# Patient Record
Sex: Female | Born: 1965 | Race: White | Hispanic: No | Marital: Married | Smoking: Never smoker
Health system: Southern US, Community
[De-identification: ages and names within clinical notes are randomized; demographics above are authoritative.]

## PROBLEM LIST (undated history)

## (undated) DIAGNOSIS — C801 Malignant (primary) neoplasm, unspecified: Secondary | ICD-10-CM

## (undated) DIAGNOSIS — C50919 Malignant neoplasm of unspecified site of unspecified female breast: Secondary | ICD-10-CM

## (undated) HISTORY — PX: ABDOMINAL HYSTERECTOMY: SHX81

---

## 2016-01-08 ENCOUNTER — Encounter (HOSPITAL_COMMUNITY): Payer: Self-pay

## 2016-01-08 ENCOUNTER — Emergency Department (HOSPITAL_COMMUNITY): Payer: PRIVATE HEALTH INSURANCE

## 2016-01-08 ENCOUNTER — Emergency Department (HOSPITAL_COMMUNITY)
Admission: EM | Admit: 2016-01-08 | Discharge: 2016-01-08 | Disposition: A | Discharge status: Home / Self Care | Payer: PRIVATE HEALTH INSURANCE | Attending: Emergency Medicine | Admitting: Emergency Medicine

## 2016-01-08 DIAGNOSIS — R1030 Lower abdominal pain, unspecified: Secondary | ICD-10-CM | POA: Diagnosis present

## 2016-01-08 DIAGNOSIS — R17 Unspecified jaundice: Secondary | ICD-10-CM

## 2016-01-08 DIAGNOSIS — K8020 Calculus of gallbladder without cholecystitis without obstruction: Secondary | ICD-10-CM | POA: Insufficient documentation

## 2016-01-08 DIAGNOSIS — Z8543 Personal history of malignant neoplasm of ovary: Secondary | ICD-10-CM | POA: Diagnosis not present

## 2016-01-08 HISTORY — DX: Malignant (primary) neoplasm, unspecified: C80.1

## 2016-01-08 LAB — COMPREHENSIVE METABOLIC PANEL
ALK PHOS: 273 U/L — AB (ref 38–126)
ALT: 315 U/L — ABNORMAL HIGH (ref 14–54)
ANION GAP: 11 (ref 5–15)
AST: 178 U/L — ABNORMAL HIGH (ref 15–41)
Albumin: 4 g/dL (ref 3.5–5.0)
BILIRUBIN TOTAL: 6.5 mg/dL — AB (ref 0.3–1.2)
BUN: 9 mg/dL (ref 6–20)
CALCIUM: 9.5 mg/dL (ref 8.9–10.3)
CO2: 22 mmol/L (ref 22–32)
Chloride: 105 mmol/L (ref 101–111)
Creatinine, Ser: 0.49 mg/dL (ref 0.44–1.00)
GLUCOSE: 102 mg/dL — AB (ref 65–99)
POTASSIUM: 3.8 mmol/L (ref 3.5–5.1)
SODIUM: 138 mmol/L (ref 135–145)
TOTAL PROTEIN: 7.7 g/dL (ref 6.5–8.1)

## 2016-01-08 LAB — CBC WITH DIFFERENTIAL/PLATELET
BASOS ABS: 0 10*3/uL (ref 0.0–0.1)
Basophils Relative: 0 %
EOS ABS: 0.2 10*3/uL (ref 0.0–0.7)
EOS PCT: 2 %
HCT: 51.5 % — ABNORMAL HIGH (ref 36.0–46.0)
Hemoglobin: 17.6 g/dL — ABNORMAL HIGH (ref 12.0–15.0)
LYMPHS PCT: 34 %
Lymphs Abs: 3.1 10*3/uL (ref 0.7–4.0)
MCH: 33.9 pg (ref 26.0–34.0)
MCHC: 34.2 g/dL (ref 30.0–36.0)
MCV: 99.2 fL (ref 78.0–100.0)
MONO ABS: 0.7 10*3/uL (ref 0.1–1.0)
Monocytes Relative: 7 %
Neutro Abs: 5.2 10*3/uL (ref 1.7–7.7)
Neutrophils Relative %: 57 %
PLATELETS: 215 10*3/uL (ref 150–400)
RBC: 5.19 MIL/uL — ABNORMAL HIGH (ref 3.87–5.11)
RDW: 14 % (ref 11.5–15.5)
WBC: 9.2 10*3/uL (ref 4.0–10.5)

## 2016-01-08 LAB — URINALYSIS, ROUTINE W REFLEX MICROSCOPIC
GLUCOSE, UA: NEGATIVE mg/dL
HGB URINE DIPSTICK: NEGATIVE
Nitrite: POSITIVE — AB
PROTEIN: 30 mg/dL — AB
Specific Gravity, Urine: 1.03 (ref 1.005–1.030)
pH: 5 (ref 5.0–8.0)

## 2016-01-08 LAB — URINE MICROSCOPIC-ADD ON: RBC / HPF: NONE SEEN RBC/hpf (ref 0–5)

## 2016-01-08 LAB — LIPASE, BLOOD: Lipase: 84 U/L — ABNORMAL HIGH (ref 11–51)

## 2016-01-08 MED ORDER — IOHEXOL 300 MG/ML  SOLN
100.0000 mL | Freq: Once | INTRAMUSCULAR | Status: AC | PRN
  Administered 2016-01-08: 100 mL via INTRAVENOUS

## 2016-01-08 MED ORDER — SODIUM CHLORIDE 0.9 % IV BOLUS (SEPSIS)
1000.0000 mL | Freq: Once | INTRAVENOUS | Status: AC
  Administered 2016-01-08: 1000 mL via INTRAVENOUS

## 2016-01-08 MED ORDER — ONDANSETRON HCL 4 MG/2ML IJ SOLN
4.0000 mg | Freq: Once | INTRAMUSCULAR | Status: AC
  Administered 2016-01-08: 4 mg via INTRAVENOUS
  Filled 2016-01-08: qty 2

## 2016-01-08 MED ORDER — SODIUM CHLORIDE 0.9 % IV SOLN: INTRAVENOUS | Status: DC

## 2016-01-08 NOTE — ED Notes (Addendum)
Pt reports 2 days ago started vomting and noticed her stools were white.  Also reports noticed sclera looked yellow.  Reports urine is orange.  Pt c/o nausea and fatigue.   Says feels like something is "sitting" in her epigastric area.

## 2016-01-08 NOTE — Discharge Instructions (Signed)
Cholelithiasis Cholelithiasis (also called gallstones) is a form of gallbladder disease in which gallstones form in your gallbladder. The gallbladder is an organ that stores bile made in the liver, which helps digest fats. Gallstones begin as small crystals and slowly grow into stones. Gallstone pain occurs when the gallbladder spasms and a gallstone is blocking the duct. Pain can also occur when a stone passes out of the duct.  RISK FACTORS  Being female.   Having multiple pregnancies. Health care providers sometimes advise removing diseased gallbladders before future pregnancies.   Being obese.  Eating a diet heavy in fried foods and fat.   Being older than 41 years and increasing age.   Prolonged use of medicines containing female hormones.   Having diabetes mellitus.   Rapidly losing weight.   Having a family history of gallstones (heredity).  SYMPTOMS  Nausea.   Vomiting.  Abdominal pain.   Yellowing of the skin (jaundice).   Sudden pain. It may persist from several minutes to several hours.  Fever.   Tenderness to the touch. In some cases, when gallstones do not move into the bile duct, people have no pain or symptoms. These are called "silent" gallstones.  TREATMENT Silent gallstones do not need treatment. In severe cases, emergency surgery may be required. Options for treatment include:  Surgery to remove the gallbladder. This is the most common treatment.  Medicines. These do not always work and may take 6-12 months or more to work.  Shock wave treatment (extracorporeal biliary lithotripsy). In this treatment an ultrasound machine sends shock waves to the gallbladder to break gallstones into smaller pieces that can pass into the intestines or be dissolved by medicine. HOME CARE INSTRUCTIONS   Only take over-the-counter or prescription medicines for pain, discomfort, or fever as directed by your health care provider.   Follow a low-fat diet until  seen again by your health care provider. Fat causes the gallbladder to contract, which can result in pain.   Follow up with your health care provider as directed. Attacks are almost always recurrent and surgery is usually required for permanent treatment.  SEEK IMMEDIATE MEDICAL CARE IF:   Your pain increases and is not controlled by medicines.   You have a fever or persistent symptoms for more than 2-3 days.   You have a fever and your symptoms suddenly get worse.   You have persistent nausea and vomiting.  MAKE SURE YOU:   Understand these instructions.  Will watch your condition.  Will get help right away if you are not doing well or get worse.   This information is not intended to replace advice given to you by your health care provider. Make sure you discuss any questions you have with your health care provider.   Document Released: 10/02/2005 Document Revised: 06/08/2013 Document Reviewed: 03/30/2013 Elsevier Interactive Patient Education 2016 Elsevier Inc.  Jaundice, Adult Jaundice is a yellowish discoloration of the skin, the whites of the eyes, and mucous membranes. Jaundice can be a sign that the liver or the bile system is not working normally. CAUSES This condition is caused by an increased level of bilirubin in the blood. Bilirubin is a substance that is produced by the normal breakdown of red blood cells. Conditions and activities that can cause an increase in the bilirubin level include:  Viral hepatitis.  Gallstones or other conditions, such as a tumor, that can cause a blockage of bile ducts.  Excessive use of alcohol.  Other liver diseases, such as cirrhosis.  Certain cancers.  Certain infections.  Certain genetic syndromes.  Certain medicines. SYMPTOMS Symptoms of this condition include:  Yellow color to the skin, the whites of the eyes, or mucous membranes.  Dark brown urine.  Stomach pain.  Light or clay-colored stool.  Itchy skin  (pruritus). DIAGNOSIS This condition is diagnosed with a medical history, physical exam, and blood tests. You may have additional tests to determine what is causing your bilirubin level to increase. TREATMENT Treatment for jaundice depends on the underlying condition. Treatment may include:  Stopping the use of a certain medicine.  Fluids that are given through an IV tube that is inserted into one of your veins.  Medicines to treat pruritus.  Surgery, if there is blockage of the bile ducts. HOME CARE INSTRUCTIONS  Drink enough fluid to keep your urine clear or pale yellow.  Do not drink alcohol.  Take medicines only as directed by your health care provider.  Keep all follow-up visits as directed by your health care provider. This is important.  You may use skin lotion to relieve itching. SEEK MEDICAL CARE IF:  You have a fever. SEEK IMMEDIATE MEDICAL CARE IF:  Your symptoms suddenly get worse.  You have symptoms for more than 72 hours.  Your pain gets worse.  You vomit repeatedly.  You become weak or confused.  You develop a severe headache.  You become severely dehydrated. Signs of severe dehydration include:  A very dry mouth.  A rapid, weak pulse.  Rapid breathing.  Blue lips.   This information is not intended to replace advice given to you by your health care provider. Make sure you discuss any questions you have with your health care provider.   Document Released: 10/06/2005 Document Revised: 02/20/2015 Document Reviewed: 10/02/2014 Elsevier Interactive Patient Education [employer].

## 2016-01-08 NOTE — ED Provider Notes (Signed)
History  By signing my name below, I, Marlowe Kays, attest that this documentation has been prepared under the direction and in the presence of Dorie Rank, MD. Electronically Signed: Marlowe Kays, ED Scribe. 01/08/2016. 12:47 PM.  Chief Complaint  Patient presents with  . Abdominal Pain   The history is provided by the patient and medical records. No language interpreter was used.    HPI Comments:  Angela Reilly is a 50 y.o. female who presents to the Emergency Department complaining of abdominal pain that began two days ago. She reports associated nausea and vomiting. She states she is experiencing lower abdominal cramping as well. She has not taken anything for her symptoms but reports drinking warm ginger ale. She denies modifying factors. She denies fever, chills. She reports having a hysterectomy. She reports PMHx of ovarian cancer.   Past Medical History  Diagnosis Date  . Cancer ([place])     ovarian   Past Surgical History  Procedure Laterality Date  . Abdominal hysterectomy     No family history on file. Social History  Substance Use Topics  . Smoking status: Never Smoker   . Smokeless tobacco: None  . Alcohol Use: No   OB History    No data available     Review of Systems  Constitutional: Negative for fever and chills.  Gastrointestinal: Positive for nausea, vomiting and abdominal pain.  All other systems reviewed and are negative.   Allergies  Review of patient's allergies indicates no known allergies.  Home Medications   Prior to Admission medications   Not on File   Triage Vitals: BP 143/101 mmHg  Pulse 84  Temp(Src) 97.8 F (36.6 C) (Oral)  Resp 20  Ht 5\' 10"  (1.778 m)  Wt 237 lb (107.502 kg)  BMI 34.01 kg/m2  SpO2 97% Physical Exam  Constitutional: She appears well-developed and well-nourished. No distress.  HENT:  Head: Normocephalic and atraumatic.  Right Ear: External ear normal.  Left Ear: External ear normal.  Eyes: Conjunctivae  are normal. Right eye exhibits no discharge. Left eye exhibits no discharge. Scleral icterus is present.  Neck: Neck supple. No tracheal deviation present.  Cardiovascular: Normal rate, regular rhythm and intact distal pulses.   Pulmonary/Chest: Effort normal and breath sounds normal. No stridor. No respiratory distress. She has no wheezes. She has no rales.  Abdominal: Soft. Bowel sounds are normal. She exhibits no distension and no mass. There is no hepatosplenomegaly. There is no tenderness. There is no rebound and no guarding. No hernia.  Musculoskeletal: She exhibits no edema or tenderness.  Neurological: She is alert. She has normal strength. No cranial nerve deficit (no facial droop, extraocular movements intact, no slurred speech) or sensory deficit. She exhibits normal muscle tone. She displays no seizure activity. Coordination normal.  Skin: Skin is warm and dry. No rash noted.  Psychiatric: She has a normal mood and affect.  Nursing note and vitals reviewed.   ED Course  Procedures (including critical care time) DIAGNOSTIC STUDIES: Oxygen Saturation is 97% on RA, normal by my interpretation.   COORDINATION OF CARE: 12:45 PM- Will order labs.  Pt verbalizes understanding and agrees to plan.  Medications  sodium chloride 0.9 % bolus 1,000 mL (0 mLs Intravenous Stopped 01/08/16 1453)    And  0.9 %  sodium chloride infusion (not administered)  ondansetron (ZOFRAN) injection 4 mg (4 mg Intravenous Given 01/08/16 1300)  iohexol (OMNIPAQUE) 300 MG/ML solution 100 mL (100 mLs Intravenous Contrast Given 01/08/16 1608)  Labs Review Labs Reviewed  CBC WITH DIFFERENTIAL/PLATELET - Abnormal; Notable for the following:    RBC 5.19 (*)    Hemoglobin 17.6 (*)    HCT 51.5 (*)    All other components within normal limits  COMPREHENSIVE METABOLIC PANEL - Abnormal; Notable for the following:    Glucose, Bld 102 (*)    AST 178 (*)    ALT 315 (*)    Alkaline Phosphatase 273 (*)    Total  Bilirubin 6.5 (*)    All other components within normal limits  URINALYSIS, ROUTINE W REFLEX MICROSCOPIC (NOT AT [facility]) - Abnormal; Notable for the following:    Color, Urine AMBER (*)    APPearance HAZY (*)    Bilirubin Urine LARGE (*)    Ketones, ur >80 (*)    Protein, ur 30 (*)    Nitrite POSITIVE (*)    Leukocytes, UA SMALL (*)    All other components within normal limits  URINE MICROSCOPIC-ADD ON - Abnormal; Notable for the following:    Squamous Epithelial / LPF TOO NUMEROUS TO COUNT (*)    Bacteria, UA MANY (*)    All other components within normal limits  LIPASE, BLOOD - Abnormal; Notable for the following:    Lipase 84 (*)    All other components within normal limits    Imaging Review US Abdomen Complete  01/08/2016  CLINICAL DATA:  Elevated liver function tests, CT scan showed possible gallstones EXAM: ABDOMEN ULTRASOUND COMPLETE COMPARISON:  None. FINDINGS: Gallbladder: Numerous tiny gallstones, mobile. No wall thickening or Murphy sign. Common bile duct: Diameter: 3 mm Liver: Mild diffuse steatosis with no focal abnormalities. IVC: No abnormality visualized. Pancreas: Visualized portion unremarkable. Spleen: Size and appearance within normal limits. Right Kidney: Length: 11.9 cm. Echogenicity within normal limits. No mass or hydronephrosis visualized. Left Kidney: Length: 12.4 cm. Echogenicity within normal limits. No mass or hydronephrosis visualized. Abdominal aorta: No aneurysm visualized. Other findings: No ascites IMPRESSION: Cholelithiasis.  Hepatic steatosis. Electronically Signed   By: Skipper Cliche M.D.   On: 01/08/2016 17:27   Ct Abdomen Pelvis W Contrast  01/08/2016  CLINICAL DATA:  Epigastric pain for 2 days.  Jaundice. EXAM: CT ABDOMEN AND PELVIS WITH CONTRAST TECHNIQUE: Multidetector CT imaging of the abdomen and pelvis was performed using the standard protocol following bolus administration of intravenous contrast. CONTRAST:  19mL OMNIPAQUE IOHEXOL 300 MG/ML   SOLN COMPARISON:  None. FINDINGS: Diffuse hepatic steatosis There is soft tissue density mature and layering in the gallbladder worrisome for gallstones. It may represent sludge. Pancreas, spleen, adrenal glands are within normal limits Simple cyst in the lower pole of the right kidney. Left kidney is unremarkable There is fatty infiltration within the wall of the cecum. Normal appendix. Sigmoid colon is decompressed. Bladder is decompressed.  Uterus is absent.  Adnexa are unremarkable Right inguinal hernia contains adipose tissue No free-fluid.  No abnormal adenopathy. IMPRESSION: Gallstones or sludge.  Ultrasound is recommended. Right inguinal hernia. Electronically Signed   By: Marybelle Killings M.D.   On: 01/08/2016 16:21   I have personally reviewed and evaluated these images and lab results as part of my medical decision-making.  Medications  sodium chloride 0.9 % bolus 1,000 mL (0 mLs Intravenous Stopped 01/08/16 1453)    And  0.9 %  sodium chloride infusion (not administered)  ondansetron (ZOFRAN) injection 4 mg (4 mg Intravenous Given 01/08/16 1300)  iohexol (OMNIPAQUE) 300 MG/ML solution 100 mL (100 mLs Intravenous Contrast Given 01/08/16 1608)  MDM   Final diagnoses:  Elevated bilirubin  Calculus of gallbladder without cholecystitis without obstruction   The patient's laboratory tests are significant for elevated bilirubin and LFTs. I was concerned about the possibility of a liver mass considering her history of ovarian cancer. CT scan did not show any evidence of mass however suggests the possibility of gallbladder sludge. Ultrasound was performed showing cholelithiasis but no evidence of choledocholithiasis. Patient doesn't have enlarged biliary or hepatic ducts.  I suspect that she may have had an episode of choledocholithiasis couple of days ago that caused symptoms today. The patient is feeling well. She's hungry. She has no abdominal pain. She does not want to be admitted to the  hospital and wants to go home. I spoke with Dr. Gala Romney. He will see her in the office for expedited evaluation. The patient will call the office tomorrow to schedule follow-up. The patient is instructed to return to the emergency room she begins having any trouble with abdominal pain fever vomiting   I personally performed the services described in this documentation, which was scribed in my presence.  The recorded information has been reviewed and is accurate.     Dorie Rank, MD 01/08/16 [id number]

## 2016-01-09 ENCOUNTER — Telehealth: Payer: Self-pay | Admitting: Gastroenterology

## 2016-01-09 NOTE — Telephone Encounter (Signed)
Pt called to make OV with [place]. She was seen in the ED yesterday and was referred to [place] as a new patient. Please advise.

## 2016-01-09 NOTE — Telephone Encounter (Signed)
Patient is on tomorrow's schedule

## 2016-01-10 ENCOUNTER — Other Ambulatory Visit: Payer: Self-pay

## 2016-01-10 ENCOUNTER — Encounter: Payer: Self-pay | Admitting: Gastroenterology

## 2016-01-10 ENCOUNTER — Ambulatory Visit (INDEPENDENT_AMBULATORY_CARE_PROVIDER_SITE_OTHER): Payer: PRIVATE HEALTH INSURANCE | Admitting: Gastroenterology

## 2016-01-10 ENCOUNTER — Other Ambulatory Visit: Payer: Self-pay | Admitting: Gastroenterology

## 2016-01-10 ENCOUNTER — Other Ambulatory Visit (HOSPITAL_COMMUNITY)
Admission: AD | Admit: 2016-01-10 | Discharge: 2016-01-10 | Disposition: A | Discharge status: Home / Self Care | Payer: No Typology Code available for payment source | Source: Ambulatory Visit | Attending: Gastroenterology | Admitting: Gastroenterology

## 2016-01-10 ENCOUNTER — Ambulatory Visit (HOSPITAL_COMMUNITY)
Admission: RE | Admit: 2016-01-10 | Discharge: 2016-01-10 | Disposition: A | Discharge status: Home / Self Care | Payer: No Typology Code available for payment source | Source: Ambulatory Visit | Attending: Gastroenterology | Admitting: Gastroenterology

## 2016-01-10 VITALS — BP 135/93 | HR 81 | Temp 97.4°F | Ht 70.0 in | Wt 237.6 lb

## 2016-01-10 DIAGNOSIS — K8021 Calculus of gallbladder without cholecystitis with obstruction: Secondary | ICD-10-CM

## 2016-01-10 DIAGNOSIS — R109 Unspecified abdominal pain: Secondary | ICD-10-CM | POA: Insufficient documentation

## 2016-01-10 DIAGNOSIS — R1013 Epigastric pain: Secondary | ICD-10-CM | POA: Diagnosis not present

## 2016-01-10 DIAGNOSIS — R7989 Other specified abnormal findings of blood chemistry: Secondary | ICD-10-CM | POA: Diagnosis present

## 2016-01-10 DIAGNOSIS — K838 Other specified diseases of biliary tract: Secondary | ICD-10-CM

## 2016-01-10 DIAGNOSIS — K831 Obstruction of bile duct: Secondary | ICD-10-CM

## 2016-01-10 LAB — COMPREHENSIVE METABOLIC PANEL
ALK PHOS: 300 U/L — AB (ref 38–126)
ALT: 208 U/L — ABNORMAL HIGH (ref 14–54)
ANION GAP: 8 (ref 5–15)
AST: 89 U/L — ABNORMAL HIGH (ref 15–41)
Albumin: 3.9 g/dL (ref 3.5–5.0)
BILIRUBIN TOTAL: 3.3 mg/dL — AB (ref 0.3–1.2)
BUN: 9 mg/dL (ref 6–20)
CALCIUM: 9 mg/dL (ref 8.9–10.3)
CO2: 23 mmol/L (ref 22–32)
CREATININE: 0.6 mg/dL (ref 0.44–1.00)
Chloride: 103 mmol/L (ref 101–111)
Glucose, Bld: 115 mg/dL — ABNORMAL HIGH (ref 65–99)
Potassium: 4 mmol/L (ref 3.5–5.1)
SODIUM: 134 mmol/L — AB (ref 135–145)
TOTAL PROTEIN: 7.3 g/dL (ref 6.5–8.1)

## 2016-01-10 LAB — PROTIME-INR
INR: 0.94 (ref 0.00–1.49)
PROTHROMBIN TIME: 12.7 s (ref 11.6–15.2)

## 2016-01-10 LAB — CBC WITH DIFFERENTIAL/PLATELET
BASOS ABS: 0 10*3/uL (ref 0.0–0.1)
BASOS PCT: 0 %
EOS ABS: 0.2 10*3/uL (ref 0.0–0.7)
EOS PCT: 2 %
HCT: 51.1 % — ABNORMAL HIGH (ref 36.0–46.0)
HEMOGLOBIN: 17.8 g/dL — AB (ref 12.0–15.0)
Lymphocytes Relative: 35 %
Lymphs Abs: 3.5 10*3/uL (ref 0.7–4.0)
MCH: 34.4 pg — ABNORMAL HIGH (ref 26.0–34.0)
MCHC: 34.8 g/dL (ref 30.0–36.0)
MCV: 98.8 fL (ref 78.0–100.0)
Monocytes Absolute: 0.7 10*3/uL (ref 0.1–1.0)
Monocytes Relative: 7 %
NEUTROS PCT: 56 %
Neutro Abs: 5.6 10*3/uL (ref 1.7–7.7)
PLATELETS: 244 10*3/uL (ref 150–400)
RBC: 5.17 MIL/uL — AB (ref 3.87–5.11)
RDW: 13.9 % (ref 11.5–15.5)
WBC: 9.9 10*3/uL (ref 4.0–10.5)

## 2016-01-10 LAB — BILIRUBIN, DIRECT: Bilirubin, Direct: 1.5 mg/dL — ABNORMAL HIGH (ref 0.1–0.5)

## 2016-01-10 LAB — LIPASE, BLOOD: LIPASE: 120 U/L — AB (ref 11–51)

## 2016-01-10 MED ORDER — HYDROCODONE-ACETAMINOPHEN 5-325 MG PO TABS: 1.0000 | ORAL_TABLET | ORAL | Status: DC | PRN

## 2016-01-10 MED ORDER — ONDANSETRON HCL 4 MG PO TABS: 4.0000 mg | ORAL_TABLET | Freq: Four times a day (QID) | ORAL | Status: DC | PRN

## 2016-01-10 NOTE — Progress Notes (Signed)
Quick Note:  Please arrange for ASAP MRCP per RMR.  Her numbers are not much better.  See if lab can add on direct bilirubin. ______

## 2016-01-10 NOTE — Patient Instructions (Signed)
Please have your labs done. Clear liquid diet only.  RX given for zofran for nausea and vicodin for pain.  If you develop fever, worsening pain, unable to stay hydrated you should go straight to the ER.

## 2016-01-10 NOTE — Progress Notes (Signed)
Primary Care Physician:  No PCP Per Patient  Primary Gastroenterologist:  Garfield Cornea, MD   Chief Complaint  Patient presents with  . Hospitalization Follow-up    Vomiting alot    HPI:  Angela Reilly is a 50 y.o. female here as an urgent new patient appointment for jaundice, vomiting, abdominal pain. This is her fifth day of illness. Presented to the emergency department earlier this week with upper abdominal pain associated with vomiting. She had a lipase minimally elevated at 84, total bilirubin of 6.5, alkaline phosphatase 273, AST 178, ALT 315. CT of the abdomen and pelvis with contrast showed fatty liver, questionable gallbladder stones versus sludge. Fatty infiltration of the wall of the cecum. Abdominal ultrasound same day with common bile duct diameter of 3 mm, numerous tiny gallstones but no wall thickening.  Patient states that they offered her admission from the ER on the 21st and that she told him that she felt better and wanted to go home even though wasn't true. She had to go home to take care of her child. She states that her pain has persisted and at times unbearable. Continues to vomit daily. Trying to consume clear liquids only. Is not taking any pain medication or nausea medication. She believes her jaundice is better. No melena or bright red blood per rectum. Last colonoscopy 8 years ago unremarkable.  Recently moved to [place] from [place]. Husband just got a new job. She is raising her 30 year old granddaughter and is expecting a new grandson any day now which she will have to raise 2. Her daughter is in prison with drug-related charges. Because of this she feels like she can't stay in the hospital. She notes that she has previously declined chemotherapy and radiation for her ovarian cancer 10 years ago because she had her newborn granddaughter to take care of. She completed a total hysterectomy only.  Patient reports 20 pound weight loss recently.   No outpatient  medications.   Allergies as of 01/10/2016  . (No Known Allergies)    Past Medical History  Diagnosis Date  . Cancer ([place])     ovarian    Past Surgical History  Procedure Laterality Date  . Abdominal hysterectomy      Family History  Problem Relation Age of Onset  . Colon cancer Neg Hx   . Drug abuse Daughter     Social History   Social History  . Marital Status: Married    Spouse Name: N/A  . Number of Children: 1  . Years of Education: N/A   Occupational History  . Not on file.   Social History Main Topics  . Smoking status: Never Smoker   . Smokeless tobacco: Not on file     Comment: Rarely  . Alcohol Use: No  . Drug Use: No  . Sexual Activity: Not on file   Other Topics Concern  . Not on file   Social History Narrative   Raising her granddaughter      ROS:  General: no fever. See hpi. Eyes: Negative for vision changes.  ENT: Negative for hoarseness, difficulty swallowing , nasal congestion. CV: Negative for chest pain, angina, palpitations, dyspnea on exertion, peripheral edema.  Respiratory: Negative for dyspnea at rest, dyspnea on exertion, cough, sputum, wheezing.  GI: See history of present illness. GU:  Negative for dysuria, hematuria, urinary incontinence, urinary frequency, nocturnal urination.  MS: Negative for joint pain, low back pain.  Derm: Negative for rash or itching.  Neuro: Negative for weakness,  abnormal sensation, seizure, frequent headaches, memory loss, confusion.  Psych: Negative for anxiety, depression, suicidal ideation, hallucinations.  [place] hpi Heme: Negative for bruising or bleeding. Allergy: Negative for rash or hives.    Physical Examination:  BP 135/93 mmHg  Pulse 81  Temp(Src) 97.4 F (36.3 C) (Oral)  Ht 5\' 10"  (1.778 m)  Wt 237 lb 9.6 oz (107.775 kg)  BMI 34.09 kg/m2   General: Well-nourished, well-developed in no acute distress.  Head: Normocephalic, atraumatic.   Eyes: Conjunctiva pink, no  icterus. Mouth: Oropharyngeal mucosa moist and pink , no lesions erythema or exudate. Neck: Supple without thyromegaly, masses, or lymphadenopathy.  Lungs: Clear to auscultation bilaterally.  Heart: Regular rate and rhythm, no murmurs rubs or gallops.  Abdomen: Bowel sounds are normal, moderate epig tenderness, nondistended, no hepatosplenomegaly or masses, no abdominal bruits or    hernia , no rebound or guarding.   Rectal: not performed Extremities: No lower extremity edema. No clubbing or deformities.  Neuro: Alert and oriented x 4 , grossly normal neurologically.  Skin: Warm and dry, no rash or jaundice.   Psych: Alert and cooperative, normal mood and affect.  Labs: Lab Results  Component Value Date   LIPASE 120* 01/10/2016   Lab Results  Component Value Date   CREATININE 0.60 01/10/2016   BUN 9 01/10/2016   NA 134* 01/10/2016   K 4.0 01/10/2016   CL 103 01/10/2016   CO2 23 01/10/2016   Lab Results  Component Value Date   ALT 208* 01/10/2016   AST 89* 01/10/2016   ALKPHOS 300* 01/10/2016   BILITOT 3.3* 01/10/2016     Imaging Studies: US Abdomen Complete  01/08/2016  CLINICAL DATA:  Elevated liver function tests, CT scan showed possible gallstones EXAM: ABDOMEN ULTRASOUND COMPLETE COMPARISON:  None. FINDINGS: Gallbladder: Numerous tiny gallstones, mobile. No wall thickening or Murphy sign. Common bile duct: Diameter: 3 mm Liver: Mild diffuse steatosis with no focal abnormalities. IVC: No abnormality visualized. Pancreas: Visualized portion unremarkable. Spleen: Size and appearance within normal limits. Right Kidney: Length: 11.9 cm. Echogenicity within normal limits. No mass or hydronephrosis visualized. Left Kidney: Length: 12.4 cm. Echogenicity within normal limits. No mass or hydronephrosis visualized. Abdominal aorta: No aneurysm visualized. Other findings: No ascites IMPRESSION: Cholelithiasis.  Hepatic steatosis. Electronically Signed   By: Skipper Cliche M.D.   On:  01/08/2016 17:27   Ct Abdomen Pelvis W Contrast  01/08/2016  CLINICAL DATA:  Epigastric pain for 2 days.  Jaundice. EXAM: CT ABDOMEN AND PELVIS WITH CONTRAST TECHNIQUE: Multidetector CT imaging of the abdomen and pelvis was performed using the standard protocol following bolus administration of intravenous contrast. CONTRAST:  167mL OMNIPAQUE IOHEXOL 300 MG/ML  SOLN COMPARISON:  None. FINDINGS: Diffuse hepatic steatosis There is soft tissue density mature and layering in the gallbladder worrisome for gallstones. It may represent sludge. Pancreas, spleen, adrenal glands are within normal limits Simple cyst in the lower pole of the right kidney. Left kidney is unremarkable There is fatty infiltration within the wall of the cecum. Normal appendix. Sigmoid colon is decompressed. Bladder is decompressed.  Uterus is absent.  Adnexa are unremarkable Right inguinal hernia contains adipose tissue No free-fluid.  No abnormal adenopathy. IMPRESSION: Gallstones or sludge.  Ultrasound is recommended. Right inguinal hernia. Electronically Signed   By: Marybelle Killings M.D.   On: 01/08/2016 16:21

## 2016-01-11 ENCOUNTER — Encounter: Payer: Self-pay | Admitting: Gastroenterology

## 2016-01-11 NOTE — Assessment & Plan Note (Signed)
50 year old female with a 5 day history of acute onset epigastric pain associated with vomiting, jaundice. Suspect biliary obstruction related to choledocholithiasis. Unfortunately the patient felt she could not stay in the hospital due to family circumstances and lied about her feeling better so she could be released from the ER couple of days ago. She continues to have ongoing symptoms with no improvement. Again she prefers to stay as an outpatient. We discussed possibility of biliary pancreatitis, cholangitis, life-threatening illness. We agreed upon stat labs today. Based on findings she will may need MRCP versus ERCP as the next step. She did agree to antiemetics and pain management at home. She will continue clear liquid diet only. If she notices fever, she is unable to stay hydrated, worsening pain she'll go straight to the emergency department.

## 2016-01-14 ENCOUNTER — Other Ambulatory Visit (HOSPITAL_COMMUNITY)
Admission: RE | Admit: 2016-01-14 | Discharge: 2016-01-14 | Disposition: A | Discharge status: Home / Self Care | Payer: PRIVATE HEALTH INSURANCE | Source: Ambulatory Visit | Attending: Gastroenterology | Admitting: Gastroenterology

## 2016-01-14 ENCOUNTER — Ambulatory Visit (HOSPITAL_COMMUNITY): Admission: RE | Admit: 2016-01-14 | Payer: PRIVATE HEALTH INSURANCE | Source: Ambulatory Visit

## 2016-01-14 ENCOUNTER — Other Ambulatory Visit: Payer: Self-pay

## 2016-01-14 ENCOUNTER — Other Ambulatory Visit: Payer: Self-pay | Admitting: Gastroenterology

## 2016-01-14 ENCOUNTER — Telehealth: Payer: Self-pay

## 2016-01-14 DIAGNOSIS — R109 Unspecified abdominal pain: Secondary | ICD-10-CM

## 2016-01-14 DIAGNOSIS — R7989 Other specified abnormal findings of blood chemistry: Secondary | ICD-10-CM

## 2016-01-14 DIAGNOSIS — R945 Abnormal results of liver function studies: Secondary | ICD-10-CM

## 2016-01-14 LAB — CBC WITH DIFFERENTIAL/PLATELET
BASOS ABS: 0 10*3/uL (ref 0.0–0.1)
Basophils Relative: 0 %
EOS PCT: 1 %
Eosinophils Absolute: 0.1 10*3/uL (ref 0.0–0.7)
HEMATOCRIT: 51.5 % — AB (ref 36.0–46.0)
HEMOGLOBIN: 17.7 g/dL — AB (ref 12.0–15.0)
LYMPHS ABS: 4 10*3/uL (ref 0.7–4.0)
LYMPHS PCT: 35 %
MCH: 34.6 pg — AB (ref 26.0–34.0)
MCHC: 34.4 g/dL (ref 30.0–36.0)
MCV: 100.6 fL — AB (ref 78.0–100.0)
Monocytes Absolute: 0.7 10*3/uL (ref 0.1–1.0)
Monocytes Relative: 6 %
NEUTROS ABS: 6.5 10*3/uL (ref 1.7–7.7)
Neutrophils Relative %: 58 %
Platelets: 299 10*3/uL (ref 150–400)
RBC: 5.12 MIL/uL — AB (ref 3.87–5.11)
RDW: 13.5 % (ref 11.5–15.5)
WBC: 11.4 10*3/uL — AB (ref 4.0–10.5)

## 2016-01-14 LAB — HEPATIC FUNCTION PANEL
ALBUMIN: 4.1 g/dL (ref 3.5–5.0)
ALT: 111 U/L — ABNORMAL HIGH (ref 14–54)
AST: 64 U/L — AB (ref 15–41)
Alkaline Phosphatase: 221 U/L — ABNORMAL HIGH (ref 38–126)
BILIRUBIN TOTAL: 1.4 mg/dL — AB (ref 0.3–1.2)
Bilirubin, Direct: 0.5 mg/dL (ref 0.1–0.5)
Indirect Bilirubin: 0.9 mg/dL (ref 0.3–0.9)
TOTAL PROTEIN: 7.4 g/dL (ref 6.5–8.1)

## 2016-01-14 LAB — LIPASE, BLOOD: LIPASE: 45 U/L (ref 11–51)

## 2016-01-14 NOTE — Telephone Encounter (Signed)
Pt is aware. Lab orders done and faxed to the lab.  Ginger, pt said a friend of hers can take her tomorrow afternoon if they can do it then.

## 2016-01-14 NOTE — Telephone Encounter (Signed)
Late Entry: Pt could not fit into the MRI at [facility]. [facility] did not have anything on Friday. I called [facility] and [facility] and they also did not have anything for Friday. I called [facility] again today and they are booked up. [facility] can do the MRCP today at 2:45pm. Pt is not able to go to [facility] today.

## 2016-01-14 NOTE — Progress Notes (Signed)
Quick Note:  LFTs somewhat improved, lipase now normal. MRI tomorrow as planned. She can go to ER at any time she feels like she needs to for worsening pain or fever.  If she has had any fever, let me know and I would advise abx until we get mri done. ______

## 2016-01-14 NOTE — Telephone Encounter (Signed)
We need STAT  LFTs, lipase, CBC at the hospital [facility]) Dx: abd pain, abnormal lfts. Ginger has a phone call in about possibly doing MRI tomorrow.  Patient still symptomatic per Ginger.

## 2016-01-14 NOTE — Telephone Encounter (Addendum)
Pt is aware to be at [facility] X-ray at 2:30 pm tomorrow

## 2016-01-14 NOTE — Progress Notes (Signed)
NO PCP PER PATIENT °

## 2016-01-15 ENCOUNTER — Other Ambulatory Visit: Payer: Self-pay | Admitting: Gastroenterology

## 2016-01-15 ENCOUNTER — Ambulatory Visit (HOSPITAL_COMMUNITY)
Admission: RE | Admit: 2016-01-15 | Discharge: 2016-01-15 | Disposition: A | Discharge status: Home / Self Care | Payer: No Typology Code available for payment source | Source: Ambulatory Visit | Attending: Gastroenterology | Admitting: Gastroenterology

## 2016-01-15 DIAGNOSIS — K831 Obstruction of bile duct: Secondary | ICD-10-CM

## 2016-01-15 DIAGNOSIS — K8021 Calculus of gallbladder without cholecystitis with obstruction: Secondary | ICD-10-CM

## 2016-01-15 DIAGNOSIS — R1013 Epigastric pain: Secondary | ICD-10-CM | POA: Diagnosis present

## 2016-01-15 DIAGNOSIS — K5710 Diverticulosis of small intestine without perforation or abscess without bleeding: Secondary | ICD-10-CM | POA: Insufficient documentation

## 2016-01-15 DIAGNOSIS — K838 Other specified diseases of biliary tract: Secondary | ICD-10-CM | POA: Insufficient documentation

## 2016-01-15 DIAGNOSIS — K8020 Calculus of gallbladder without cholecystitis without obstruction: Secondary | ICD-10-CM | POA: Insufficient documentation

## 2016-01-15 MED ORDER — GADOBENATE DIMEGLUMINE 529 MG/ML IV SOLN
20.0000 mL | Freq: Once | INTRAVENOUS | Status: AC | PRN
  Administered 2016-01-15: 20 mL via INTRAVENOUS

## 2016-01-16 ENCOUNTER — Other Ambulatory Visit: Payer: Self-pay

## 2016-01-16 DIAGNOSIS — K8020 Calculus of gallbladder without cholecystitis without obstruction: Secondary | ICD-10-CM

## 2016-01-16 NOTE — Progress Notes (Signed)
Quick Note:  Multiple gallstones but no choledocholithiasis and normal bile duct diameter.  I suspect she passed a stone over a week ago. Her gallstones may be causing her problems currently but no overt acute cholecystitis. Her LFTs and lipase were better two days ago.  She needs to get her gallbladder out. How is she doing? ______

## 2016-01-21 MED ORDER — GADOBENATE DIMEGLUMINE 529 MG/ML IV SOLN
20.0000 mL | Freq: Once | INTRAVENOUS | Status: AC | PRN
  Administered 2016-01-15: 20 mL via INTRAVENOUS

## 2016-01-21 NOTE — Progress Notes (Signed)
Quick Note:  Shes will need to have LFTs in 2 weeks. ______

## 2016-01-22 ENCOUNTER — Other Ambulatory Visit: Payer: Self-pay

## 2016-01-22 ENCOUNTER — Other Ambulatory Visit: Payer: Self-pay | Admitting: Gastroenterology

## 2016-01-22 DIAGNOSIS — K831 Obstruction of bile duct: Secondary | ICD-10-CM

## 2016-01-24 ENCOUNTER — Ambulatory Visit (HOSPITAL_COMMUNITY): Payer: PRIVATE HEALTH INSURANCE

## 2016-03-05 NOTE — H&P (Signed)
  NTS SOAP Note  Vital Signs:  Vitals as of: 0000000: Systolic 123456: Diastolic 123456: Heart Rate 89: Temp 97.54F (Temporal): Height 71ft 10.25in: Weight 237Lbs 0 Ounces: Pain Level 6: BMI 33.76   BMI : 33.76 kg/m2  Subjective: This 50 year old female presents for of biliary colic secondary to cholelithiasis.  Has been having right upper quadrant abdominal pain with radiation to the back, nausea, and bloating.  Made worse with fatty foods.  No fever, chills, jaundice.  Review of Symptoms:  Constitutional:unremarkable   Head:unremarkable Eyes:unremarkable   Nose/Mouth/Throat:unremarkable Cardiovascular:  unremarkable Respiratory:unremarkable Gastrointestinabdominal pain, nausea Genitourinary:unremarkable   joint, neck, and back pain Skin:unremarkable Hematolgic/Lymphatic:unremarkable   Allergic/Immunologic:unremarkable   Past Medical History:  Reviewed  Past Medical History  Surgical History: TAH Medical Problems: none Allergies: nkda Medications: none   Social History:Reviewed  Social History  Preferred Language: English Race:  White Ethnicity: Not Hispanic / Latino Age: 50 year Marital Status:  M Alcohol: no   Smoking Status: Light tobacco smoker reviewed on 01/24/2016 Started Date:  Packs per week:  Functional Status reviewed on 01/24/2016 ------------------------------------------------ Bathing: Normal Cooking: Normal Dressing: Normal Driving: Normal Eating: Normal Managing Meds: Normal Oral Care: Normal Shopping: Normal Toileting: Normal Transferring: Normal Walking: Normal Cognitive Status reviewed on 01/24/2016 ------------------------------------------------ Attention: Normal Decision Making: Normal Language: Normal Memory: Normal Motor: Normal Perception: Normal Problem Solving: Normal Visual and Spatial: Normal   Family History:Reviewed  Family Health History Family History is Unknown    Objective  Information: General:Well appearing, well nourished in no distress. Heart:RRR, no murmur or gallop.  Normal S1, S2.  No S3, S4.  Lungs:  CTA bilaterally, no wheezes, rhonchi, rales.  Breathing unlabored. Abdomen:Soft, NT/ND, no HSM, no masses. MRI, u/s reviewed Assessment:Biliary colic, cholelithiasis  Diagnoses: 574.20  K80.20 Gallstone (Calculus of gallbladder without cholecystitis without obstruction)  Procedures: [id number] - OFFICE OUTPATIENT NEW 30 MINUTES    Plan:  Patient will call to scheduled laparoscopic cholecystectomy.   Patient Education:Alternative treatments to surgery were discussed with patient (and family).  Risks and benefits  of procedure including bleeding, infection, hepatobiliary injury, and the possibility of an open procedure were fully explained to the patient (and family) who gave informed consent. Patient/family questions were addressed.  Follow-up:Pending Surgery

## 2016-03-10 NOTE — Patient Instructions (Signed)
Angela Reilly  03/10/2016     @PREFPERIOPPHARMACY @   Your procedure is scheduled on 03/19/2016.  Report to [facility] at 6:15 A.M.  Call this number if you have problems the morning of surgery:  [phone]   Remember:  Do not eat food or drink liquids after midnight.  Take these medicines the morning of surgery with A SIP OF WATER Hydrocodone and zofran if needed   Do not wear jewelry, make-up or nail polish.  Do not wear lotions, powders, or perfumes.  You may wear deodorant.  Do not shave 48 hours prior to surgery.  Men may shave face and neck.  Do not bring valuables to the hospital.  [facility] is not responsible for any belongings or valuables.  Contacts, dentures or bridgework may not be worn into surgery.  Leave your suitcase in the car.  After surgery it may be brought to your room.  For patients admitted to the hospital, discharge time will be determined by your treatment team.  Patients discharged the day of surgery will not be allowed to drive home.    Please read over the following fact sheets that you were given. Surgical Site Infection Prevention and Anesthesia Post-op Instructions     PATIENT INSTRUCTIONS POST-ANESTHESIA  IMMEDIATELY FOLLOWING SURGERY:  Do not drive or operate machinery for the first twenty four hours after surgery.  Do not make any important decisions for twenty four hours after surgery or while taking narcotic pain medications or sedatives.  If you develop intractable nausea and vomiting or a severe headache please notify your doctor immediately.  FOLLOW-UP:  Please make an appointment with your surgeon as instructed. You do not need to follow up with anesthesia unless specifically instructed to do so.  WOUND CARE INSTRUCTIONS (if applicable):  Keep a dry clean dressing on the anesthesia/puncture wound site if there is drainage.  Once the wound has quit draining you may leave it open to air.  Generally you should leave the bandage intact  for twenty four hours unless there is drainage.  If the epidural site drains for more than 36-48 hours please call the anesthesia department.  QUESTIONS?:  Please feel free to call your physician or the hospital operator if you have any questions, and they will be happy to assist you.      Laparoscopic Cholecystectomy Laparoscopic cholecystectomy is surgery to remove the gallbladder. The gallbladder is located in the upper right part of the abdomen, behind the liver. It is a storage sac for bile, which is produced in the liver. Bile aids in the digestion and absorption of fats. Cholecystectomy is often done for inflammation of the gallbladder (cholecystitis). This condition is usually caused by a buildup of gallstones (cholelithiasis) in the gallbladder. Gallstones can block the flow of bile, and that can result in inflammation and pain. In severe cases, emergency surgery may be required. If emergency surgery is not required, you will have time to prepare for the procedure. Laparoscopic surgery is an alternative to open surgery. Laparoscopic surgery has a shorter recovery time. Your common bile duct may also need to be examined during the procedure. If stones are found in the common bile duct, they may be removed. LET [facility] CARE PROVIDER KNOW ABOUT:  Any allergies you have.  All medicines you are taking, including vitamins, herbs, eye drops, creams, and over-the-counter medicines.  Previous problems you or members of your family have had with the use of anesthetics.  Any blood disorders you have.  Previous surgeries you have had.  Any medical conditions you have. RISKS AND COMPLICATIONS Generally, this is a safe procedure. However, problems may occur, including:  Infection.  Bleeding.  Allergic reactions to medicines.  Damage to other structures or organs.  A stone remaining in the common bile duct.  A bile leak from the cyst duct that is clipped when your gallbladder is  removed.  The need to convert to open surgery, which requires a larger incision in the abdomen. This may be necessary if your surgeon thinks that it is not safe to continue with a laparoscopic procedure. BEFORE THE PROCEDURE  Ask your health care provider about:  Changing or stopping your regular medicines. This is especially important if you are taking diabetes medicines or blood thinners.  Taking medicines such as aspirin and ibuprofen. These medicines can thin your blood. Do not take these medicines before your procedure if your health care provider instructs you not to.  Follow instructions from your health care provider about eating or drinking restrictions.  Let your health care provider know if you develop a cold or an infection before surgery.  Plan to have someone take you home after the procedure.  Ask your health care provider how your surgical site will be marked or identified.  You may be given antibiotic medicine to help prevent infection. PROCEDURE  To reduce your risk of infection:  Your health care team will wash or sanitize their hands.  Your skin will be washed with soap.  An IV tube may be inserted into one of your veins.  You will be given a medicine to make you fall asleep (general anesthetic).  A breathing tube will be placed in your mouth.  The surgeon will make several small cuts (incisions) in your abdomen.  A thin, lighted tube (laparoscope) that has a tiny camera on the end will be inserted through one of the small incisions. The camera on the laparoscope will send a picture to a TV screen (monitor) in the operating room. This will give the surgeon a good view inside your abdomen.  A gas will be pumped into your abdomen. This will expand your abdomen to give the surgeon more room to perform the surgery.  Other tools that are needed for the procedure will be inserted through the other incisions. The gallbladder will be removed through one of the  incisions.  After your gallbladder has been removed, the incisions will be closed with stitches (sutures), staples, or skin glue.  Your incisions may be covered with a bandage (dressing). The procedure may vary among health care providers and hospitals. AFTER THE PROCEDURE  Your blood pressure, heart rate, breathing rate, and blood oxygen level will be monitored often until the medicines you were given have worn off.  You will be given medicines as needed to control your pain.   This information is not intended to replace advice given to you by your health care provider. Make sure you discuss any questions you have with your health care provider.   Document Released: 10/06/2005 Document Revised: 06/27/2015 Document Reviewed: 05/18/2013 Elsevier Interactive Patient Education [employer].

## 2016-03-12 ENCOUNTER — Encounter (HOSPITAL_COMMUNITY): Payer: Self-pay

## 2016-03-12 ENCOUNTER — Encounter (HOSPITAL_COMMUNITY)
Admission: RE | Admit: 2016-03-12 | Discharge: 2016-03-12 | Disposition: A | Discharge status: Home / Self Care | Payer: BLUE CROSS/BLUE SHIELD | Source: Ambulatory Visit | Attending: General Surgery | Admitting: General Surgery

## 2016-03-12 DIAGNOSIS — K8020 Calculus of gallbladder without cholecystitis without obstruction: Secondary | ICD-10-CM | POA: Diagnosis not present

## 2016-03-12 DIAGNOSIS — Z01812 Encounter for preprocedural laboratory examination: Secondary | ICD-10-CM | POA: Diagnosis present

## 2016-03-12 LAB — CBC WITH DIFFERENTIAL/PLATELET
BASOS PCT: 0 %
Basophils Absolute: 0 10*3/uL (ref 0.0–0.1)
Eosinophils Absolute: 0.1 10*3/uL (ref 0.0–0.7)
Eosinophils Relative: 1 %
HEMATOCRIT: 54.6 % — AB (ref 36.0–46.0)
HEMOGLOBIN: 18.8 g/dL — AB (ref 12.0–15.0)
LYMPHS ABS: 3.5 10*3/uL (ref 0.7–4.0)
Lymphocytes Relative: 33 %
MCH: 34.8 pg — AB (ref 26.0–34.0)
MCHC: 34.4 g/dL (ref 30.0–36.0)
MCV: 101.1 fL — AB (ref 78.0–100.0)
MONO ABS: 0.8 10*3/uL (ref 0.1–1.0)
MONOS PCT: 7 %
NEUTROS ABS: 6.1 10*3/uL (ref 1.7–7.7)
NEUTROS PCT: 59 %
Platelets: 259 10*3/uL (ref 150–400)
RBC: 5.4 MIL/uL — ABNORMAL HIGH (ref 3.87–5.11)
RDW: 14.8 % (ref 11.5–15.5)
WBC: 10.5 10*3/uL (ref 4.0–10.5)

## 2016-03-12 LAB — BASIC METABOLIC PANEL
ANION GAP: 5 (ref 5–15)
BUN: 8 mg/dL (ref 6–20)
CALCIUM: 9.6 mg/dL (ref 8.9–10.3)
CHLORIDE: 107 mmol/L (ref 101–111)
CO2: 24 mmol/L (ref 22–32)
Creatinine, Ser: 0.46 mg/dL (ref 0.44–1.00)
GFR calc non Af Amer: >60 mL/min (ref >60)
GLUCOSE: 90 mg/dL (ref 65–99)
Potassium: 4.8 mmol/L (ref 3.5–5.1)
Sodium: 136 mmol/L (ref 135–145)

## 2016-03-12 LAB — HEPATIC FUNCTION PANEL
ALBUMIN: 4.1 g/dL (ref 3.5–5.0)
ALT: 25 U/L (ref 14–54)
AST: 22 U/L (ref 15–41)
Alkaline Phosphatase: 94 U/L (ref 38–126)
BILIRUBIN DIRECT: 0.2 mg/dL (ref 0.1–0.5)
BILIRUBIN TOTAL: 0.9 mg/dL (ref 0.3–1.2)
Indirect Bilirubin: 0.7 mg/dL (ref 0.3–0.9)
Total Protein: 6.7 g/dL (ref 6.5–8.1)

## 2016-03-12 NOTE — Patient Instructions (Signed)
Angela Reilly  03/12/2016     @PREFPERIOPPHARMACY @   Your procedure is scheduled on 03/19/2016.  Report to [facility] at 6:15 A.M.  Call this number if you have problems the morning of surgery:  [phone]   Remember:  Do not eat food or drink liquids after midnight.  Take these medicines the morning of surgery with A SIP OF WATER Hydrocodone and Zofran if needed   Do not wear jewelry, make-up or nail polish.  Do not wear lotions, powders, or perfumes.  You may wear deodorant.  Do not shave 48 hours prior to surgery.  Men may shave face and neck.  Do not bring valuables to the hospital.  [facility] is not responsible for any belongings or valuables.  Contacts, dentures or bridgework may not be worn into surgery.  Leave your suitcase in the car.  After surgery it may be brought to your room.  For patients admitted to the hospital, discharge time will be determined by your treatment team.  Patients discharged the day of surgery will not be allowed to drive home.    Please read over the following fact sheets that you were given. Surgical Site Infection Prevention and Anesthesia Post-op Instructions     PATIENT INSTRUCTIONS POST-ANESTHESIA  IMMEDIATELY FOLLOWING SURGERY:  Do not drive or operate machinery for the first twenty four hours after surgery.  Do not make any important decisions for twenty four hours after surgery or while taking narcotic pain medications or sedatives.  If you develop intractable nausea and vomiting or a severe headache please notify your doctor immediately.  FOLLOW-UP:  Please make an appointment with your surgeon as instructed. You do not need to follow up with anesthesia unless specifically instructed to do so.  WOUND CARE INSTRUCTIONS (if applicable):  Keep a dry clean dressing on the anesthesia/puncture wound site if there is drainage.  Once the wound has quit draining you may leave it open to air.  Generally you should leave the bandage intact  for twenty four hours unless there is drainage.  If the epidural site drains for more than 36-48 hours please call the anesthesia department.  QUESTIONS?:  Please feel free to call your physician or the hospital operator if you have any questions, and they will be happy to assist you.      Laparoscopic Cholecystectomy Laparoscopic cholecystectomy is surgery to remove the gallbladder. The gallbladder is located in the upper right part of the abdomen, behind the liver. It is a storage sac for bile, which is produced in the liver. Bile aids in the digestion and absorption of fats. Cholecystectomy is often done for inflammation of the gallbladder (cholecystitis). This condition is usually caused by a buildup of gallstones (cholelithiasis) in the gallbladder. Gallstones can block the flow of bile, and that can result in inflammation and pain. In severe cases, emergency surgery may be required. If emergency surgery is not required, you will have time to prepare for the procedure. Laparoscopic surgery is an alternative to open surgery. Laparoscopic surgery has a shorter recovery time. Your common bile duct may also need to be examined during the procedure. If stones are found in the common bile duct, they may be removed. LET [facility] CARE PROVIDER KNOW ABOUT:  Any allergies you have.  All medicines you are taking, including vitamins, herbs, eye drops, creams, and over-the-counter medicines.  Previous problems you or members of your family have had with the use of anesthetics.  Any blood disorders you have.  Previous surgeries you have had.  Any medical conditions you have. RISKS AND COMPLICATIONS Generally, this is a safe procedure. However, problems may occur, including:  Infection.  Bleeding.  Allergic reactions to medicines.  Damage to other structures or organs.  A stone remaining in the common bile duct.  A bile leak from the cyst duct that is clipped when your gallbladder is  removed.  The need to convert to open surgery, which requires a larger incision in the abdomen. This may be necessary if your surgeon thinks that it is not safe to continue with a laparoscopic procedure. BEFORE THE PROCEDURE  Ask your health care provider about:  Changing or stopping your regular medicines. This is especially important if you are taking diabetes medicines or blood thinners.  Taking medicines such as aspirin and ibuprofen. These medicines can thin your blood. Do not take these medicines before your procedure if your health care provider instructs you not to.  Follow instructions from your health care provider about eating or drinking restrictions.  Let your health care provider know if you develop a cold or an infection before surgery.  Plan to have someone take you home after the procedure.  Ask your health care provider how your surgical site will be marked or identified.  You may be given antibiotic medicine to help prevent infection. PROCEDURE  To reduce your risk of infection:  Your health care team will wash or sanitize their hands.  Your skin will be washed with soap.  An IV tube may be inserted into one of your veins.  You will be given a medicine to make you fall asleep (general anesthetic).  A breathing tube will be placed in your mouth.  The surgeon will make several small cuts (incisions) in your abdomen.  A thin, lighted tube (laparoscope) that has a tiny camera on the end will be inserted through one of the small incisions. The camera on the laparoscope will send a picture to a TV screen (monitor) in the operating room. This will give the surgeon a good view inside your abdomen.  A gas will be pumped into your abdomen. This will expand your abdomen to give the surgeon more room to perform the surgery.  Other tools that are needed for the procedure will be inserted through the other incisions. The gallbladder will be removed through one of the  incisions.  After your gallbladder has been removed, the incisions will be closed with stitches (sutures), staples, or skin glue.  Your incisions may be covered with a bandage (dressing). The procedure may vary among health care providers and hospitals. AFTER THE PROCEDURE  Your blood pressure, heart rate, breathing rate, and blood oxygen level will be monitored often until the medicines you were given have worn off.  You will be given medicines as needed to control your pain.   This information is not intended to replace advice given to you by your health care provider. Make sure you discuss any questions you have with your health care provider.   Document Released: 10/06/2005 Document Revised: 06/27/2015 Document Reviewed: 05/18/2013 Elsevier Interactive Patient Education [employer].

## 2016-03-13 ENCOUNTER — Inpatient Hospital Stay (HOSPITAL_COMMUNITY)
Admission: RE | Admit: 2016-03-13 | Discharge: 2016-03-13 | Disposition: A | Discharge status: Home / Self Care | Payer: No Typology Code available for payment source | Source: Ambulatory Visit

## 2016-03-19 ENCOUNTER — Ambulatory Visit (HOSPITAL_COMMUNITY)
Admission: RE | Admit: 2016-03-19 | Discharge: 2016-03-19 | Disposition: A | Discharge status: Home / Self Care | Payer: BLUE CROSS/BLUE SHIELD | Source: Ambulatory Visit | Attending: General Surgery | Admitting: General Surgery

## 2016-03-19 ENCOUNTER — Encounter (HOSPITAL_COMMUNITY)
Admission: RE | Disposition: A | Discharge status: Home / Self Care | Payer: Self-pay | Source: Ambulatory Visit | Attending: General Surgery

## 2016-03-19 ENCOUNTER — Encounter (HOSPITAL_COMMUNITY): Payer: Self-pay | Admitting: *Deleted

## 2016-03-19 ENCOUNTER — Ambulatory Visit (HOSPITAL_COMMUNITY): Payer: BLUE CROSS/BLUE SHIELD | Admitting: Anesthesiology

## 2016-03-19 DIAGNOSIS — K8010 Calculus of gallbladder with chronic cholecystitis without obstruction: Secondary | ICD-10-CM | POA: Insufficient documentation

## 2016-03-19 DIAGNOSIS — F17200 Nicotine dependence, unspecified, uncomplicated: Secondary | ICD-10-CM | POA: Diagnosis not present

## 2016-03-19 DIAGNOSIS — K8020 Calculus of gallbladder without cholecystitis without obstruction: Secondary | ICD-10-CM | POA: Diagnosis present

## 2016-03-19 HISTORY — PX: CHOLECYSTECTOMY: SHX55

## 2016-03-19 SURGERY — LAPAROSCOPIC CHOLECYSTECTOMY
Anesthesia: General | Site: Abdomen

## 2016-03-19 MED ORDER — HYDROCODONE-ACETAMINOPHEN 5-325 MG PO TABS: 1.0000 | ORAL_TABLET | ORAL | Status: AC | PRN

## 2016-03-19 MED ORDER — GLYCOPYRROLATE 0.2 MG/ML IJ SOLN
INTRAMUSCULAR | Status: AC
  Filled 2016-03-19: qty 1

## 2016-03-19 MED ORDER — GLYCOPYRROLATE 0.2 MG/ML IJ SOLN
0.2000 mg | Freq: Once | INTRAMUSCULAR | Status: AC
  Administered 2016-03-19: 0.2 mg via INTRAVENOUS

## 2016-03-19 MED ORDER — ONDANSETRON HCL 4 MG/2ML IJ SOLN
4.0000 mg | Freq: Once | INTRAMUSCULAR | Status: AC
  Administered 2016-03-19: 4 mg via INTRAVENOUS

## 2016-03-19 MED ORDER — ONDANSETRON HCL 4 MG/2ML IJ SOLN
4.0000 mg | Freq: Once | INTRAMUSCULAR | Status: AC | PRN
  Administered 2016-03-19: 4 mg via INTRAVENOUS
  Filled 2016-03-19: qty 2

## 2016-03-19 MED ORDER — FENTANYL CITRATE (PF) 100 MCG/2ML IJ SOLN: 25.0000 ug | INTRAMUSCULAR | Status: DC | PRN

## 2016-03-19 MED ORDER — HEMOSTATIC AGENTS (NO CHARGE) OPTIME
TOPICAL | Status: DC | PRN
  Administered 2016-03-19: 1 via TOPICAL

## 2016-03-19 MED ORDER — FENTANYL CITRATE (PF) 250 MCG/5ML IJ SOLN
INTRAMUSCULAR | Status: AC
  Filled 2016-03-19: qty 5

## 2016-03-19 MED ORDER — SODIUM CHLORIDE 0.9 % IR SOLN
Status: DC | PRN
  Administered 2016-03-19: 1000 mL

## 2016-03-19 MED ORDER — SODIUM CHLORIDE 0.9 % IJ SOLN
INTRAMUSCULAR | Status: AC
  Filled 2016-03-19: qty 10

## 2016-03-19 MED ORDER — NEOSTIGMINE METHYLSULFATE 10 MG/10ML IV SOLN
INTRAVENOUS | Status: AC
  Filled 2016-03-19: qty 1

## 2016-03-19 MED ORDER — FENTANYL CITRATE (PF) 100 MCG/2ML IJ SOLN
INTRAMUSCULAR | Status: DC | PRN
  Administered 2016-03-19: 100 ug via INTRAVENOUS
  Administered 2016-03-19: 50 ug via INTRAVENOUS
  Administered 2016-03-19: 100 ug via INTRAVENOUS

## 2016-03-19 MED ORDER — ONDANSETRON HCL 4 MG/2ML IJ SOLN
INTRAMUSCULAR | Status: AC
  Filled 2016-03-19: qty 2

## 2016-03-19 MED ORDER — KETOROLAC TROMETHAMINE 30 MG/ML IJ SOLN
30.0000 mg | Freq: Once | INTRAMUSCULAR | Status: AC
  Administered 2016-03-19: 30 mg via INTRAVENOUS
  Filled 2016-03-19: qty 1

## 2016-03-19 MED ORDER — MIDAZOLAM HCL 2 MG/2ML IJ SOLN
1.0000 mg | INTRAMUSCULAR | Status: DC | PRN
  Administered 2016-03-19: 2 mg via INTRAVENOUS

## 2016-03-19 MED ORDER — NEOSTIGMINE METHYLSULFATE 10 MG/10ML IV SOLN
INTRAVENOUS | Status: DC | PRN
  Administered 2016-03-19: 1 mg via INTRAVENOUS
  Administered 2016-03-19: 2 mg via INTRAVENOUS

## 2016-03-19 MED ORDER — ROCURONIUM BROMIDE 50 MG/5ML IV SOLN
INTRAVENOUS | Status: AC
  Filled 2016-03-19: qty 1

## 2016-03-19 MED ORDER — EPHEDRINE SULFATE 50 MG/ML IJ SOLN
INTRAMUSCULAR | Status: AC
  Filled 2016-03-19: qty 1

## 2016-03-19 MED ORDER — MIDAZOLAM HCL 2 MG/2ML IJ SOLN
INTRAMUSCULAR | Status: AC
  Filled 2016-03-19: qty 2

## 2016-03-19 MED ORDER — MIDAZOLAM HCL 5 MG/5ML IJ SOLN
INTRAMUSCULAR | Status: DC | PRN
  Administered 2016-03-19: 2 mg via INTRAVENOUS

## 2016-03-19 MED ORDER — LACTATED RINGERS IV SOLN
INTRAVENOUS | Status: DC
  Administered 2016-03-19: 1000 mL via INTRAVENOUS

## 2016-03-19 MED ORDER — PROPOFOL 10 MG/ML IV BOLUS
INTRAVENOUS | Status: AC
  Filled 2016-03-19: qty 20

## 2016-03-19 MED ORDER — BUPIVACAINE HCL (PF) 0.5 % IJ SOLN
INTRAMUSCULAR | Status: DC | PRN
  Administered 2016-03-19: 10 mL

## 2016-03-19 MED ORDER — CIPROFLOXACIN IN D5W 400 MG/200ML IV SOLN
400.0000 mg | INTRAVENOUS | Status: AC
  Administered 2016-03-19: 400 mg via INTRAVENOUS
  Filled 2016-03-19: qty 200

## 2016-03-19 MED ORDER — LIDOCAINE HCL (PF) 1 % IJ SOLN
INTRAMUSCULAR | Status: AC
  Filled 2016-03-19: qty 5

## 2016-03-19 MED ORDER — ROCURONIUM BROMIDE 100 MG/10ML IV SOLN
INTRAVENOUS | Status: DC | PRN
  Administered 2016-03-19: 40 mg via INTRAVENOUS

## 2016-03-19 MED ORDER — BUPIVACAINE HCL (PF) 0.5 % IJ SOLN
INTRAMUSCULAR | Status: AC
Start: 2016-03-19 — End: 2016-03-19
  Filled 2016-03-19: qty 30

## 2016-03-19 MED ORDER — POVIDONE-IODINE 10 % OINT PACKET
TOPICAL_OINTMENT | CUTANEOUS | Status: DC | PRN
  Administered 2016-03-19: 1 via TOPICAL

## 2016-03-19 MED ORDER — PROPOFOL 10 MG/ML IV BOLUS
INTRAVENOUS | Status: DC | PRN
  Administered 2016-03-19: 160 mg via INTRAVENOUS

## 2016-03-19 MED ORDER — GLYCOPYRROLATE 0.2 MG/ML IJ SOLN
INTRAMUSCULAR | Status: DC | PRN
  Administered 2016-03-19: 0.6 mg via INTRAVENOUS

## 2016-03-19 MED ORDER — POVIDONE-IODINE 10 % EX OINT
TOPICAL_OINTMENT | CUTANEOUS | Status: AC
  Filled 2016-03-19: qty 1

## 2016-03-19 MED ORDER — GLYCOPYRROLATE 0.2 MG/ML IJ SOLN
INTRAMUSCULAR | Status: AC
  Filled 2016-03-19: qty 3

## 2016-03-19 MED ORDER — SUCCINYLCHOLINE CHLORIDE 20 MG/ML IJ SOLN
INTRAMUSCULAR | Status: AC
  Filled 2016-03-19: qty 1

## 2016-03-19 MED ORDER — DEXTROSE 5 % IV SOLN
INTRAVENOUS | Status: DC | PRN
  Administered 2016-03-19 (×2): via INTRAVENOUS

## 2016-03-19 MED ORDER — LIDOCAINE HCL 1 % IJ SOLN
INTRAMUSCULAR | Status: DC | PRN
  Administered 2016-03-19: 25 mg via INTRADERMAL

## 2016-03-19 MED ORDER — CHLORHEXIDINE GLUCONATE 4 % EX LIQD: 1.0000 "application " | Freq: Once | CUTANEOUS | Status: DC

## 2016-03-19 SURGICAL SUPPLY — 44 items
APPLIER CLIP LAPSCP 10X32 DD (CLIP) ×3 IMPLANT
BAG HAMPER (MISCELLANEOUS) ×3 IMPLANT
CHLORAPREP W/TINT 26ML (MISCELLANEOUS) ×3 IMPLANT
CLOTH BEACON ORANGE TIMEOUT ST (SAFETY) ×3 IMPLANT
COVER LIGHT HANDLE STERIS (MISCELLANEOUS) ×6 IMPLANT
DECANTER SPIKE VIAL GLASS SM (MISCELLANEOUS) ×3 IMPLANT
ELECT REM PT RETURN 9FT ADLT (ELECTROSURGICAL) ×3
ELECTRODE REM PT RTRN 9FT ADLT (ELECTROSURGICAL) ×1 IMPLANT
FILTER SMOKE EVAC LAPAROSHD (FILTER) ×3 IMPLANT
FORMALIN 10 PREFIL 120ML (MISCELLANEOUS) ×3 IMPLANT
GLOVE BIOGEL PI IND STRL 7.0 (GLOVE) ×1 IMPLANT
GLOVE BIOGEL PI IND STRL 7.5 (GLOVE) ×2 IMPLANT
GLOVE BIOGEL PI INDICATOR 7.0 (GLOVE) ×2
GLOVE BIOGEL PI INDICATOR 7.5 (GLOVE) ×4
GLOVE ECLIPSE 6.5 STRL STRAW (GLOVE) ×3 IMPLANT
GLOVE ECLIPSE 7.0 STRL STRAW (GLOVE) ×6 IMPLANT
GLOVE SURG SS PI 7.5 STRL IVOR (GLOVE) ×3 IMPLANT
GOWN STRL REUS W/ TWL XL LVL3 (GOWN DISPOSABLE) ×1 IMPLANT
GOWN STRL REUS W/TWL LRG LVL3 (GOWN DISPOSABLE) ×9 IMPLANT
GOWN STRL REUS W/TWL XL LVL3 (GOWN DISPOSABLE) ×2
HEMOSTAT SNOW SURGICEL 2X4 (HEMOSTASIS) ×3 IMPLANT
INST SET LAPROSCOPIC AP (KITS) ×3 IMPLANT
IV NS IRRIG 3000ML ARTHROMATIC (IV SOLUTION) IMPLANT
KIT ROOM TURNOVER APOR (KITS) ×3 IMPLANT
MANIFOLD NEPTUNE II (INSTRUMENTS) ×3 IMPLANT
NEEDLE INSUFFLATION 14GA 120MM (NEEDLE) ×3 IMPLANT
NS IRRIG 1000ML POUR BTL (IV SOLUTION) ×3 IMPLANT
PACK LAP CHOLE LZT030E (CUSTOM PROCEDURE TRAY) ×3 IMPLANT
PAD ARMBOARD 7.5X6 YLW CONV (MISCELLANEOUS) ×3 IMPLANT
POUCH SPECIMEN RETRIEVAL 10MM (ENDOMECHANICALS) ×3 IMPLANT
SET BASIN LINEN APH (SET/KITS/TRAYS/PACK) ×3 IMPLANT
SET TUBE IRRIG SUCTION NO TIP (IRRIGATION / IRRIGATOR) IMPLANT
SLEEVE ENDOPATH XCEL 5M (ENDOMECHANICALS) ×3 IMPLANT
SPONGE GAUZE 2X2 8PLY STER LF (GAUZE/BANDAGES/DRESSINGS) ×4
SPONGE GAUZE 2X2 8PLY STRL LF (GAUZE/BANDAGES/DRESSINGS) ×8 IMPLANT
STAPLER VISISTAT (STAPLE) ×3 IMPLANT
SUT VICRYL 0 UR6 27IN ABS (SUTURE) ×3 IMPLANT
TAPE CLOTH SURG 4X10 WHT LF (GAUZE/BANDAGES/DRESSINGS) ×3 IMPLANT
TROCAR ENDO BLADELESS 11MM (ENDOMECHANICALS) ×3 IMPLANT
TROCAR XCEL NON-BLD 5MMX100MML (ENDOMECHANICALS) ×3 IMPLANT
TROCAR XCEL UNIV SLVE 11M 100M (ENDOMECHANICALS) ×3 IMPLANT
TUBING INSUFFLATION (TUBING) ×3 IMPLANT
WARMER LAPAROSCOPE (MISCELLANEOUS) ×3 IMPLANT
YANKAUER SUCT 12FT TUBE ARGYLE (SUCTIONS) IMPLANT

## 2016-03-19 NOTE — Anesthesia Postprocedure Evaluation (Signed)
Anesthesia Post Note  Patient: Angela Reilly  Procedure(s) Performed: Procedure(s) (LRB): LAPAROSCOPIC CHOLECYSTECTOMY (N/A)  Patient location during evaluation: PACU Anesthesia Type: General Level of consciousness: awake, oriented and patient cooperative Pain management: pain level controlled Vital Signs Assessment: post-procedure vital signs reviewed and stable Respiratory status: spontaneous breathing, nonlabored ventilation and respiratory function stable Cardiovascular status: blood pressure returned to baseline and stable Anesthetic complications: no    Last Vitals:  Filed Vitals:   03/19/16 0730 03/19/16 0735  BP: 121/82 113/78  Pulse:    Temp:    Resp: 22 22    Last Pain:  Filed Vitals:   03/19/16 0735  PainSc: 6                  Adamariz Gillott J

## 2016-03-19 NOTE — Transfer of Care (Signed)
Immediate Anesthesia Transfer of Care Note  Patient: Angela Reilly  Procedure(s) Performed: Procedure(s): LAPAROSCOPIC CHOLECYSTECTOMY (N/A)  Patient Location: PACU  Anesthesia Type:General  Level of Consciousness: awake and patient cooperative  Airway & Oxygen Therapy: Patient Spontanous Breathing and Patient connected to face mask oxygen  Post-op Assessment: Report given to RN, Post -op Vital signs reviewed and stable and Patient moving all extremities  Post vital signs: Reviewed and stable  Last Vitals:  Filed Vitals:   03/19/16 0730 03/19/16 0735  BP: 121/82 113/78  Pulse:    Temp:    Resp: 22 22    Last Pain:  Filed Vitals:   03/19/16 0735  PainSc: 6       Patients Stated Pain Goal: 9 ([id number])  Complications: No apparent anesthesia complications

## 2016-03-19 NOTE — Op Note (Signed)
Patient:  Angela Reilly  DOB:  Aug 15, 1966  MRN:  [id number]   Preop Diagnosis:  Biliary colic, cholelithiasis  Postop Diagnosis:  Same  Procedure:  Laparoscopic cholecystectomy  Surgeon:  Aviva Signs, M.D.  Assistant: Tama High, M.D.  Anes:  Gen. endotracheal  Indications:  Patient is a 50 year old white female who presents with biliary colic secondary to cholelithiasis. The risks and benefits of the procedure including bleeding, infection, hepatobiliary injury, and the possibility of an open procedure were fully explained to the patient, who gave informed consent.  Procedure note:  The patient was placed the supine position. After induction of general endotracheal anesthesia, the abdomen was prepped and draped using the usual sterile technique with DuraPrep. Surgical site confirmation was performed.  A supraumbilical incision was made down to the fascia. A Veress needle was introduced into the abdominal cavity and confirmation of placement was done using the saline drop test. The abdomen was then insufflated to 16 mmHg pressure. An 11 mm trocar was introduced into the abdominal cavity under direct visualization without difficulty. The patient was placed in reverse Trendelenburg position and an additional 11 mm trocar was placed the epigastric region 5 mm trochars were placed the right upper quadrant right flank regions. Liver was inspected and noted to be within normal limits. The gallbladder was retracted in a dynamic fashion in order to provide a critical view of the triangle of Calot. The cystic duct was first identified. Its juncture to the infundibulum was fully identified. Endoclips were placed proximally and distally on the cystic duct, and the cystic duct was divided. This was likewise done to the cystic artery. The gallbladder was freed away from the gallbladder fossa using Bovie electrocautery. The gallbladder was delivered through the epigastric trocar site using an Endo Catch  bag. The gallbladder fossa was inspected and no abnormal bleeding or bile leakage was noted. Surgicel was placed the gallbladder fossa. All fluid and air were then evacuated from the abdominal cavity prior to removal of the trochars.  All wounds were irrigated with normal saline. All wounds were injected with 0.5% Sensorcaine. The supraumbilical fascia as well as epigastric fascia reapproximated using 0 Vicryl interrupted sutures. All skin incisions were closed using staples. Betadine ointment and dry sterile dressings were applied.  All tape and needle counts were correct at the end of the procedure. Patient was extubated in the operating room and transferred to PACU in stable condition.    Complications:  None  EBL:  Minimal  Specimen:  Gallbladder

## 2016-03-19 NOTE — Interval H&P Note (Signed)
History and Physical Interval Note:  03/19/2016 7:13 AM  Angela Reilly  has presented today for surgery, with the diagnosis of cholelithiasis  The various methods of treatment have been discussed with the patient and family. After consideration of risks, benefits and other options for treatment, the patient has consented to  Procedure(s): LAPAROSCOPIC CHOLECYSTECTOMY (N/A) as a surgical intervention .  The patient's history has been reviewed, patient examined, no change in status, stable for surgery.  I have reviewed the patient's chart and labs.  Questions were answered to the patient's satisfaction.     Aviva Signs A

## 2016-03-19 NOTE — Anesthesia Preprocedure Evaluation (Signed)
Anesthesia Evaluation  Patient identified by MRN, date of birth, ID band Patient awake    Reviewed: Allergy & Precautions, NPO status , Patient's Chart, lab work & pertinent test results  Airway Mallampati: II  TM Distance: >3 FB     Dental  (+) Teeth Intact, Chipped,    Pulmonary neg pulmonary ROS,    breath sounds clear to auscultation       Cardiovascular negative cardio ROS   Rhythm:Regular Rate:Normal     Neuro/Psych    GI/Hepatic negative GI ROS,   Endo/Other    Renal/GU      Musculoskeletal   Abdominal   Peds  Hematology   Anesthesia Other Findings   Reproductive/Obstetrics                             Anesthesia Physical Anesthesia Plan  ASA: I  Anesthesia Plan: General   Post-op Pain Management:    Induction: Intravenous  Airway Management Planned: Oral ETT  Additional Equipment:   Intra-op Plan:   Post-operative Plan: Extubation in OR  Informed Consent: I have reviewed the patients History and Physical, chart, labs and discussed the procedure including the risks, benefits and alternatives for the proposed anesthesia with the patient or authorized representative who has indicated his/her understanding and acceptance.     Plan Discussed with:   Anesthesia Plan Comments:         Anesthesia Quick Evaluation

## 2016-03-19 NOTE — Discharge Instructions (Signed)
Laparoscopic Cholecystectomy, Care After °Refer to this sheet in the next few weeks. These instructions provide you with information about caring for yourself after your procedure. Your health care provider may also give you more specific instructions. Your treatment has been planned according to current medical practices, but problems sometimes occur. Call your health care provider if you have any problems or questions after your procedure. °WHAT TO EXPECT AFTER THE PROCEDURE °After your procedure, it is common to have: °· Pain at your incision sites. You will be given pain medicines to control your pain. °· Mild nausea or vomiting. This should improve after the first 24 hours. °· Bloating and possible shoulder pain from the gas that was used during the procedure. This will improve after the first 24 hours. °HOME CARE INSTRUCTIONS °Incision Care °· Follow instructions from your health care provider about how to take care of your incisions. Make sure you: °¨ Wash your hands with soap and water before you change your bandage (dressing). If soap and water are not available, use hand sanitizer. °¨ Change your dressing as told by your health care provider. °¨ Leave stitches (sutures), skin glue, or adhesive strips in place. These skin closures may need to be in place for 2 weeks or longer. If adhesive strip edges start to loosen and curl up, you may trim the loose edges. Do not remove adhesive strips completely unless your health care provider tells you to do that. °· Do not take baths, swim, or use a hot tub until your health care provider approves. Ask your health care provider if you can take showers. You may only be allowed to take sponge baths for bathing. °General Instructions °· Take over-the-counter and prescription medicines only as told by your health care provider. °· Do not drive or operate heavy machinery while taking prescription pain medicine. °· Return to your normal diet as told by your health care  provider. °· Do not lift anything that is heavier than 10 lb (4.5 kg). °· Do not play contact sports for one week or until your health care provider approves. °SEEK MEDICAL CARE IF:  °· You have redness, swelling, or pain at the site of your incision. °· You have fluid, blood, or pus coming from your incision. °· You notice a bad smell coming from your incision area. °· Your surgical incisions break open. °· You have a fever. °SEEK IMMEDIATE MEDICAL CARE IF: °· You develop a rash. °· You have difficulty breathing. °· You have chest pain. °· You have increasing pain in your shoulders (shoulder strap areas). °· You faint or have dizzy episodes while you are standing. °· You have severe pain in your abdomen. °· You have nausea or vomiting that lasts for more than one day. °  °This information is not intended to replace advice given to you by your health care provider. Make sure you discuss any questions you have with your health care provider. °  °Document Released: 10/06/2005 Document Revised: 06/27/2015 Document Reviewed: 05/18/2013 °Elsevier Interactive Patient Education ©2016 [employer] ° °

## 2016-03-19 NOTE — Anesthesia Procedure Notes (Signed)
Procedure Name: Intubation Date/Time: 03/19/2016 7:48 AM Performed by: Charmaine Downs Pre-anesthesia Checklist: Emergency Drugs available, Patient identified, Suction available and Patient being monitored Patient Re-evaluated:Patient Re-evaluated prior to inductionOxygen Delivery Method: Circle system utilized Preoxygenation: Pre-oxygenation with 100% oxygen Intubation Type: IV induction Ventilation: Mask ventilation without difficulty Laryngoscope Size: Mac and 3 Grade View: Grade I Tube type: Oral Tube size: 7.0 mm Number of attempts: 1 Airway Equipment and Method: Stylet and Oral airway Placement Confirmation: ETT inserted through vocal cords under direct vision,  breath sounds checked- equal and bilateral and positive ETCO2 Secured at: 22 cm Tube secured with: Tape Dental Injury: Teeth and Oropharynx as per pre-operative assessment

## 2016-03-20 ENCOUNTER — Encounter (HOSPITAL_COMMUNITY): Payer: Self-pay | Admitting: General Surgery

## 2016-10-11 ENCOUNTER — Encounter (HOSPITAL_COMMUNITY): Payer: Self-pay | Admitting: Emergency Medicine

## 2016-10-11 ENCOUNTER — Emergency Department (HOSPITAL_COMMUNITY)
Admission: EM | Admit: 2016-10-11 | Discharge: 2016-10-11 | Discharge status: Against Medical Advice | Payer: BLUE CROSS/BLUE SHIELD | Attending: Emergency Medicine | Admitting: Emergency Medicine

## 2016-10-11 ENCOUNTER — Emergency Department (HOSPITAL_COMMUNITY): Payer: BLUE CROSS/BLUE SHIELD

## 2016-10-11 DIAGNOSIS — Y999 Unspecified external cause status: Secondary | ICD-10-CM | POA: Insufficient documentation

## 2016-10-11 DIAGNOSIS — Z8543 Personal history of malignant neoplasm of ovary: Secondary | ICD-10-CM | POA: Insufficient documentation

## 2016-10-11 DIAGNOSIS — Y939 Activity, unspecified: Secondary | ICD-10-CM | POA: Diagnosis not present

## 2016-10-11 DIAGNOSIS — G40909 Epilepsy, unspecified, not intractable, without status epilepticus: Secondary | ICD-10-CM | POA: Insufficient documentation

## 2016-10-11 DIAGNOSIS — R569 Unspecified convulsions: Secondary | ICD-10-CM

## 2016-10-11 DIAGNOSIS — Y92002 Bathroom of unspecified non-institutional (private) residence single-family (private) house as the place of occurrence of the external cause: Secondary | ICD-10-CM | POA: Insufficient documentation

## 2016-10-11 DIAGNOSIS — W1809XA Striking against other object with subsequent fall, initial encounter: Secondary | ICD-10-CM | POA: Insufficient documentation

## 2016-10-11 DIAGNOSIS — R791 Abnormal coagulation profile: Secondary | ICD-10-CM | POA: Insufficient documentation

## 2016-10-11 DIAGNOSIS — R200 Anesthesia of skin: Secondary | ICD-10-CM

## 2016-10-11 LAB — RAPID URINE DRUG SCREEN, HOSP PERFORMED
AMPHETAMINES: NOT DETECTED
BARBITURATES: NOT DETECTED
BENZODIAZEPINES: NOT DETECTED
Cocaine: NOT DETECTED
Opiates: NOT DETECTED
TETRAHYDROCANNABINOL: NOT DETECTED

## 2016-10-11 LAB — CBC
HEMATOCRIT: 53.5 % — AB (ref 36.0–46.0)
HEMOGLOBIN: 18.3 g/dL — AB (ref 12.0–15.0)
MCH: 35.3 pg — ABNORMAL HIGH (ref 26.0–34.0)
MCHC: 34.2 g/dL (ref 30.0–36.0)
MCV: 103.1 fL — ABNORMAL HIGH (ref 78.0–100.0)
Platelets: 260 10*3/uL (ref 150–400)
RBC: 5.19 MIL/uL — ABNORMAL HIGH (ref 3.87–5.11)
RDW: 13.9 % (ref 11.5–15.5)
WBC: 11.3 10*3/uL — AB (ref 4.0–10.5)

## 2016-10-11 LAB — URINALYSIS, ROUTINE W REFLEX MICROSCOPIC
Bacteria, UA: NONE SEEN
Bilirubin Urine: NEGATIVE
GLUCOSE, UA: NEGATIVE mg/dL
Ketones, ur: NEGATIVE mg/dL
Leukocytes, UA: NEGATIVE
Nitrite: NEGATIVE
Protein, ur: NEGATIVE mg/dL
SPECIFIC GRAVITY, URINE: 1.002 — AB (ref 1.005–1.030)
pH: 6 (ref 5.0–8.0)

## 2016-10-11 LAB — COMPREHENSIVE METABOLIC PANEL
ALT: 26 U/L (ref 14–54)
AST: 27 U/L (ref 15–41)
Albumin: 4.2 g/dL (ref 3.5–5.0)
Alkaline Phosphatase: 93 U/L (ref 38–126)
Anion gap: 10 (ref 5–15)
BUN: 7 mg/dL (ref 6–20)
CHLORIDE: 107 mmol/L (ref 101–111)
CO2: 22 mmol/L (ref 22–32)
CREATININE: 0.6 mg/dL (ref 0.44–1.00)
Calcium: 9.7 mg/dL (ref 8.9–10.3)
GFR calc Af Amer: >60 mL/min (ref >60)
GLUCOSE: 112 mg/dL — AB (ref 65–99)
Potassium: 4 mmol/L (ref 3.5–5.1)
SODIUM: 139 mmol/L (ref 135–145)
Total Bilirubin: 0.6 mg/dL (ref 0.3–1.2)
Total Protein: 7.5 g/dL (ref 6.5–8.1)

## 2016-10-11 LAB — DIFFERENTIAL
BASOS ABS: 0 10*3/uL (ref 0.0–0.1)
BASOS PCT: 0 %
Eosinophils Absolute: 0.1 10*3/uL (ref 0.0–0.7)
Eosinophils Relative: 1 %
Lymphocytes Relative: 48 %
Lymphs Abs: 5.4 10*3/uL — ABNORMAL HIGH (ref 0.7–4.0)
MONOS PCT: 4 %
Monocytes Absolute: 0.5 10*3/uL (ref 0.1–1.0)
NEUTROS ABS: 5.4 10*3/uL (ref 1.7–7.7)
Neutrophils Relative %: 47 %

## 2016-10-11 LAB — ETHANOL: ALCOHOL ETHYL (B): 241 mg/dL — AB (ref <5)

## 2016-10-11 LAB — APTT: APTT: 34 s (ref 24–36)

## 2016-10-11 LAB — PROTIME-INR
INR: 0.95
Prothrombin Time: 12.7 seconds (ref 11.4–15.2)

## 2016-10-11 LAB — I-STAT TROPONIN, ED: Troponin i, poc: 0.01 ng/mL (ref 0.00–0.08)

## 2016-10-11 NOTE — ED Notes (Signed)
Pt with left sided weakness that pt states has been going on for 3-7 days.  C/o of left face"feeling funny", decrease in sensation to left side as well.  Unable to complete swallow screen due to pt refusing step of eating a cracker.  Pt becomes tearful stating all she wants to do is go home.  States her daughter and newborn grandson is at home. Pt admits to having a lot of stress at home.  Pt smells of ETOH and admitted to triage one glass of wine. Pt states that she has right breast cancer.  Pt also states that she has coughed up bright red blood the other night.

## 2016-10-11 NOTE — ED Triage Notes (Signed)
Per pt's spouse pt has had L arm numbness and weakness X5 days. Today she drank 1 glass of wine and had a fall, head hit the wall, states LOC, blurred vision. Pt is jumping from one subject to the other.

## 2016-10-11 NOTE — ED Notes (Signed)
Pt is often uncooperative with assessment,  EDP in room to explain to pt concerning pt's dx and admission,  Pt verbally refusing admission.  EDP explained to pt that she is not able to leave AMA currently due to ETOH level and will reassess in an hour.

## 2016-10-11 NOTE — ED Notes (Signed)
Pt's spouse came into triage room as pt left stating "I'm sure she has had more than 1". Pt's speech is slurred.

## 2016-10-11 NOTE — ED Notes (Signed)
Went in pt's room after CN went in,  Pt tearful and looking at CN and I wide eyes as if she was confused, asked pt to state month and pt stated it was August, unable to state year, pt stated that she was at home when asked where she was.  Pt's daughters at bedside and witnessed pt's confusion

## 2016-10-11 NOTE — ED Notes (Signed)
Tammy with CT states that pt had what appeared seizure activity.  Tammy stated that pt started to come around when calling to update this nurse.  EDP Dr. Laverta Baltimore notified.

## 2016-10-11 NOTE — ED Notes (Signed)
Pt's husband came out to state that pt was pulling leads off, went in room and found pt out of bed standing putting her clothes back on.  Explained to pt not legally able to decide to leave AMA due to her ETOH level.  Pt became upset and stated that she only had a glass and an half of wine,  Explained to pt that her ETOH level was too high.  Pt then yelled out that for nurse to call the cops.  Asked secretary to call [employer] and got EDP due to pt leaving, CN also notified.  Pt left out of EMS door with IV still in as well.   Security caught pt and family outside along with CN.  Pt came back in to have IV taken out and for EDP to speak with pt.  CN had AMA form signed and pt left ambulatory with steady gait.

## 2016-10-11 NOTE — ED Provider Notes (Signed)
Emergency Department Provider Note   I have reviewed the triage vital signs and the nursing notes.   HISTORY  Chief Complaint Loss of Consciousness   HPI Angela Reilly is a 50 y.o. female with PMH of anxiety, seizure disorder (no AEDs in many years) presents to the emergency department for evaluation of numbness on the left side of her face and arm for the past 5 days with new onset fall at home. The patient's husband at bedside states that she has had numbness in her face and left arm for the past 5 days. She has not appreciated any weakness. She has not appreciated any leg numbness. Symptoms have not improved significantly since onset. Husband states that he works third shift and he heard a fall in the bathroom today and went to the find the patient on the floor. She was awake and alert. No obvious seizure activity. He notes that she has had at least 1-2 drinks this morning which is not totally unusual for her but that she does not drink daily. Patient endorses 1.5 glasses of wine this AM. Denies other drug use.   The patient is exhibiting some slurred speech which she states started before she was drinking alcohol but she cannot recall an exact time of onset. She denies any chest pain or difficulty breathing. No fevers. She believes that she like to go home and that we are just "quacks who killed my daughter." She has difficulty giving a full history 2/2 to intermittent agitation and disconnected story telling.    Past Medical History:  Diagnosis Date  . Cancer [facility])    ovarian    Patient Active Problem List   Diagnosis Date Noted  . Obstructive jaundice 01/10/2016  . Abdominal pain, epigastric 01/10/2016  . Gallstones with biliary obstruction 01/10/2016    Past Surgical History:  Procedure Laterality Date  . ABDOMINAL HYSTERECTOMY    . CHOLECYSTECTOMY N/A 03/19/2016   Procedure: LAPAROSCOPIC CHOLECYSTECTOMY;  Surgeon: Aviva Signs, MD;  Location: [facility];  Service: General;   Laterality: N/A;    Current Outpatient Rx  . Order #: [id number] Class: Print    Allergies Patient has no known allergies.  Family History  Problem Relation Age of Onset  . Drug abuse Daughter   . Colon cancer Neg Hx     Social History Social History  Substance Use Topics  . Smoking status: Never Smoker  . Smokeless tobacco: Not on file     Comment: Rarely  . Alcohol use No    Review of Systems  Constitutional: No fever/chills Eyes: No visual changes. ENT: No sore throat. Cardiovascular: Denies chest pain. Respiratory: Denies shortness of breath. Gastrointestinal: No abdominal pain.  No nausea, no vomiting.  No diarrhea.  No constipation. Genitourinary: Negative for dysuria. Musculoskeletal: Negative for back pain. Skin: Negative for rash. Neurological: Negative for headaches, focal weakness. Positive left side numbness x 5 days. Positive slurred speech.   10-point ROS otherwise negative.  ____________________________________________   PHYSICAL EXAM:  VITAL SIGNS: ED Triage Vitals  Enc Vitals Group     BP 10/11/16 1533 135/74     Pulse Rate 10/11/16 1533 95     Resp 10/11/16 1533 18     Temp 10/11/16 1533 97.9 F (36.6 C)     Temp Source 10/11/16 1533 Oral     SpO2 10/11/16 1533 95 %     Weight 10/11/16 1533 200 lb (90.7 kg)     Height 10/11/16 1533 5\' 10"  (1.778 m)  Pain Score 10/11/16 1536 8   Constitutional: Alert and somewhat agitated. Smells of EtOH and finds it difficult to tell a cohesive story.  Eyes: Conjunctivae are normal. Head: Atraumatic. Nose: No congestion/rhinnorhea. Mouth/Throat: Mucous membranes are moist.  Oropharynx non-erythematous. Neck: No stridor. No cervical spine tenderness to palpation. Cardiovascular: Normal rate, regular rhythm. Good peripheral circulation. Grossly normal heart sounds.   Respiratory: Normal respiratory effort.  No retractions. Lungs CTAB. Gastrointestinal: Soft and nontender. No distention.    Musculoskeletal: No lower extremity tenderness nor edema. No gross deformities of extremities. Neurologic: Slurred speech and language is scattered. Decreased sensation to light touch over left face and arm. 4+/5 strength in triceps and biceps. No pronator drift. Exam limited by patient's inconsistent level of cooperation.   Skin:  Skin is warm, dry and intact. No rash noted.  ____________________________________________   LABS (all labs ordered are listed, but only abnormal results are displayed)  Labs Reviewed  ETHANOL - Abnormal; Notable for the following:       Result Value   Alcohol, Ethyl (B) 241 (*)    All other components within normal limits  CBC - Abnormal; Notable for the following:    WBC 11.3 (*)    RBC 5.19 (*)    Hemoglobin 18.3 (*)    HCT 53.5 (*)    MCV 103.1 (*)    MCH 35.3 (*)    All other components within normal limits  DIFFERENTIAL - Abnormal; Notable for the following:    Lymphs Abs 5.4 (*)    All other components within normal limits  COMPREHENSIVE METABOLIC PANEL - Abnormal; Notable for the following:    Glucose, Bld 112 (*)    All other components within normal limits  URINALYSIS, ROUTINE W REFLEX MICROSCOPIC - Abnormal; Notable for the following:    Color, Urine COLORLESS (*)    Specific Gravity, Urine 1.002 (*)    Hgb urine dipstick SMALL (*)    All other components within normal limits  PROTIME-INR  APTT  RAPID URINE DRUG SCREEN, HOSP PERFORMED  I-STAT TROPOININ, ED  CBG MONITORING, ED   ____________________________________________  EKG   EKG Interpretation  Date/Time:  Saturday October 11 2016 15:47:10 EST Ventricular Rate:  90 PR Interval:    QRS Duration: 86 QT Interval:  375 QTC Calculation: 459 R Axis:   76 Text Interpretation:  Sinus rhythm Low voltage, precordial leads No STEMI.  Confirmed by LONG MD, JOSHUA [phone]) on 10/11/2016 4:01:17 PM       ____________________________________________  RADIOLOGY  Ct Head Wo  Contrast  Result Date: 10/11/2016 CLINICAL DATA:  Left-sided weakness for several days, initial encounter EXAM: CT HEAD WITHOUT CONTRAST TECHNIQUE: Contiguous axial images were obtained from the base of the skull through the vertex without intravenous contrast. COMPARISON:  None. FINDINGS: Brain: Mild atrophic changes are noted. No findings to suggest acute hemorrhage, acute infarction or space-occupying mass lesion are noted. Vascular: No hyperdense vessel or unexpected calcification. Skull: Normal. Negative for fracture or focal lesion. Sinuses/Orbits: No acute finding. Other: None. IMPRESSION: Mild atrophic changes are noted.  No acute abnormality is seen. Electronically Signed   By: Inez Catalina M.D.   On: 10/11/2016 16:54    ____________________________________________   PROCEDURES  Procedure(s) performed:   Procedures  None ____________________________________________   INITIAL IMPRESSION / ASSESSMENT AND PLAN / ED COURSE  Pertinent labs & imaging results that were available during my care of the patient were reviewed by me and considered in my medical decision making (see  chart for details).  Patient resents to the parts department for evaluation of fall with some agitation along with 5 days of left sided numbness. She has some associated left-sided weakness on my exam but patient also smells of alcohol and has slurred speech which may be contributing to inconsistent neuro findings and complicated the overall clinical picture. The patient endorses only drinking one half glasses of wine this morning. She also has a remote seizure history and states that she is not taken Dilantin or phenytoin and 8 years with no breakthrough seizures. The husband heard her fall as he was napping went to check on her and do not see any obvious seizure activity. Patient is not having any chest pain or dyspnea. Patient may have had a stroke several days ago with numbness over the last 5 days but is unclear  if the slurred speech and fall related to strokelike symptoms or something else such as alcohol. Plan for CT of the head along with stroke labs. EKG with no evidence of acute ischemia or underlying arrhythmia.  04:15 PM Called by nursing that patient apparently had a seizure in CT. Event lasted for only a few seconds and patient was groggy afterwards. Completing CT now and will reevaluate once back in the ED.   05:42 PM Updated patient on CT scan results and urinalysis. I strongly recommended that she be admitted to the hospital for MRI and stroke workup. She is also had a seizure here and reportedly at home now the new family members are at the bedside describing a seizure. She does have a history of seizure but has been off of medications for the last 8 years. The patient is adamant about returning home. We had a long discussion about the risks of continued seizure and a separate discussion about the risk of not evaluating fully for possible stroke in the risk of the return stroke that is more severe, potentially debilitating, or even fatal. Given the patient's elevated alcohol level on arrival I plan to wait an hour before allowing her to sign out [place] however she is able to have a complete conversation with me at this time and seems to understand fully the consequences of her presumed decision to leave [place] and she is allowed 2. The patient refused to undergo a swallow screen and so keep her NPO for now. Daughter and granddaughter at bedside and trying to convince the patient to accept the admission but she continues to refuse.   06:10 PM  I was alerted by nursing staff the patient had left the emergency department with her IV in place. She was standing on the sidewalk and waiting for family to pick her up in the car. She is ambulatory without difficulty or assistance. Speaking much more clearly and appears to be clinically sober. I was able to convince her to return  to the emergency department for detailed discussion about her surgeon to sign out [place]. We were able to remove the IV. She and her husband were present during the conversation. She understands that her decision to leave could lead to death or serious disability. She also understands that she cannot drive because of breakthrough seizure activity. I encouraged them to return to the ED at any time if she changes her mind.    ____________________________________________  FINAL CLINICAL IMPRESSION(S) / ED DIAGNOSES  Final diagnoses:  Left arm numbness  Seizure ([place])     MEDICATIONS GIVEN DURING THIS VISIT:  None  NEW OUTPATIENT MEDICATIONS STARTED DURING THIS VISIT:  None   Note:  This document was prepared using Dragon voice recognition software and may include unintentional dictation errors.  Nanda Quinton, MD Emergency Medicine   Margette Fast, MD 10/11/16 [phone]

## 2016-10-11 NOTE — ED Notes (Signed)
Dr. Laverta Baltimore discussed the risks of pt leaving ama in great detail with patient and her husband several times.   Pt says she understands and signed out ama.

## 2016-10-14 ENCOUNTER — Encounter (HOSPITAL_COMMUNITY): Payer: Self-pay | Admitting: *Deleted

## 2016-10-14 DIAGNOSIS — Z8543 Personal history of malignant neoplasm of ovary: Secondary | ICD-10-CM | POA: Diagnosis not present

## 2016-10-14 DIAGNOSIS — I639 Cerebral infarction, unspecified: Secondary | ICD-10-CM | POA: Insufficient documentation

## 2016-10-14 DIAGNOSIS — Z5321 Procedure and treatment not carried out due to patient leaving prior to being seen by health care provider: Secondary | ICD-10-CM | POA: Insufficient documentation

## 2016-10-14 LAB — COMPREHENSIVE METABOLIC PANEL
ALK PHOS: 100 U/L (ref 38–126)
ALT: 31 U/L (ref 14–54)
ANION GAP: 11 (ref 5–15)
AST: 31 U/L (ref 15–41)
Albumin: 4.1 g/dL (ref 3.5–5.0)
BUN: 7 mg/dL (ref 6–20)
CALCIUM: 9.6 mg/dL (ref 8.9–10.3)
CO2: 23 mmol/L (ref 22–32)
Chloride: 106 mmol/L (ref 101–111)
Creatinine, Ser: 0.72 mg/dL (ref 0.44–1.00)
GFR calc non Af Amer: >60 mL/min (ref >60)
Glucose, Bld: 95 mg/dL (ref 65–99)
POTASSIUM: 4.5 mmol/L (ref 3.5–5.1)
SODIUM: 140 mmol/L (ref 135–145)
Total Bilirubin: 0.8 mg/dL (ref 0.3–1.2)
Total Protein: 6.9 g/dL (ref 6.5–8.1)

## 2016-10-14 LAB — CBC WITH DIFFERENTIAL/PLATELET
Basophils Absolute: 0 10*3/uL (ref 0.0–0.1)
Basophils Relative: 0 %
EOS ABS: 0.2 10*3/uL (ref 0.0–0.7)
EOS PCT: 2 %
HCT: 52.9 % — ABNORMAL HIGH (ref 36.0–46.0)
HEMOGLOBIN: 19.1 g/dL — AB (ref 12.0–15.0)
LYMPHS ABS: 5.6 10*3/uL — AB (ref 0.7–4.0)
Lymphocytes Relative: 55 %
MCH: 36.5 pg — AB (ref 26.0–34.0)
MCHC: 36.1 g/dL — AB (ref 30.0–36.0)
MCV: 101 fL — ABNORMAL HIGH (ref 78.0–100.0)
MONO ABS: 0.6 10*3/uL (ref 0.1–1.0)
MONOS PCT: 5 %
Neutro Abs: 3.9 10*3/uL (ref 1.7–7.7)
Neutrophils Relative %: 38 %
PLATELETS: 263 10*3/uL (ref 150–400)
RBC: 5.24 MIL/uL — ABNORMAL HIGH (ref 3.87–5.11)
RDW: 13.6 % (ref 11.5–15.5)
WBC: 10.2 10*3/uL (ref 4.0–10.5)

## 2016-10-14 LAB — CBG MONITORING, ED: Glucose-Capillary: 95 mg/dL (ref 65–99)

## 2016-10-14 LAB — ETHANOL: Alcohol, Ethyl (B): 169 mg/dL — ABNORMAL HIGH (ref <5)

## 2016-10-14 NOTE — ED Triage Notes (Signed)
The pt is here with her husband  With stroke symptoms from 2 days ago  She was diagnosed with strokes at [place]  Her symptoms have not changed  Her husband brought her here because she is no better.  She is walking talking he reports no change from 2 days ago   Lt sided facial droop from 2 days ago

## 2016-10-14 NOTE — ED Triage Notes (Signed)
Pt discussed with dr Vanita Panda   Basic labs no imaging.  Will order when the pt comes to the back

## 2016-10-15 ENCOUNTER — Emergency Department (HOSPITAL_COMMUNITY)
Admission: EM | Admit: 2016-10-15 | Discharge: 2016-10-15 | Disposition: A | Discharge status: Home / Self Care | Payer: BLUE CROSS/BLUE SHIELD | Attending: Emergency Medicine | Admitting: Emergency Medicine

## 2016-10-15 NOTE — ED Notes (Signed)
No answer

## 2017-09-30 ENCOUNTER — Other Ambulatory Visit: Payer: Self-pay

## 2017-09-30 ENCOUNTER — Emergency Department (HOSPITAL_COMMUNITY)
Admission: EM | Admit: 2017-09-30 | Discharge: 2017-09-30 | Disposition: A | Discharge status: Home / Self Care | Payer: BLUE CROSS/BLUE SHIELD | Attending: Emergency Medicine | Admitting: Emergency Medicine

## 2017-09-30 ENCOUNTER — Encounter (HOSPITAL_COMMUNITY): Payer: Self-pay | Admitting: *Deleted

## 2017-09-30 DIAGNOSIS — Z853 Personal history of malignant neoplasm of breast: Secondary | ICD-10-CM | POA: Insufficient documentation

## 2017-09-30 DIAGNOSIS — Z206 Contact with and (suspected) exposure to human immunodeficiency virus [HIV]: Secondary | ICD-10-CM | POA: Insufficient documentation

## 2017-09-30 DIAGNOSIS — Z8543 Personal history of malignant neoplasm of ovary: Secondary | ICD-10-CM | POA: Diagnosis not present

## 2017-09-30 DIAGNOSIS — Y998 Other external cause status: Secondary | ICD-10-CM | POA: Insufficient documentation

## 2017-09-30 DIAGNOSIS — Y9389 Activity, other specified: Secondary | ICD-10-CM | POA: Insufficient documentation

## 2017-09-30 DIAGNOSIS — Y929 Unspecified place or not applicable: Secondary | ICD-10-CM | POA: Insufficient documentation

## 2017-09-30 DIAGNOSIS — Z7721 Contact with and (suspected) exposure to potentially hazardous body fluids: Secondary | ICD-10-CM | POA: Diagnosis present

## 2017-09-30 HISTORY — DX: Malignant neoplasm of unspecified site of unspecified female breast: C50.919

## 2017-09-30 LAB — RAPID HIV SCREEN (HIV 1/2 AB+AG)
HIV 1/2 Antibodies: NONREACTIVE
HIV-1 P24 ANTIGEN - HIV24: NONREACTIVE

## 2017-09-30 LAB — COMPREHENSIVE METABOLIC PANEL
ALBUMIN: 4.4 g/dL (ref 3.5–5.0)
ALK PHOS: 84 U/L (ref 38–126)
ALT: 30 U/L (ref 14–54)
ANION GAP: 11 (ref 5–15)
AST: 30 U/L (ref 15–41)
BILIRUBIN TOTAL: 1 mg/dL (ref 0.3–1.2)
BUN: 6 mg/dL (ref 6–20)
CALCIUM: 9.2 mg/dL (ref 8.9–10.3)
CO2: 20 mmol/L — AB (ref 22–32)
CREATININE: 0.57 mg/dL (ref 0.44–1.00)
Chloride: 106 mmol/L (ref 101–111)
GFR calc Af Amer: >60 mL/min (ref >60)
GFR calc non Af Amer: >60 mL/min (ref >60)
GLUCOSE: 104 mg/dL — AB (ref 65–99)
Potassium: 3.7 mmol/L (ref 3.5–5.1)
SODIUM: 137 mmol/L (ref 135–145)
TOTAL PROTEIN: 7.3 g/dL (ref 6.5–8.1)

## 2017-09-30 MED ORDER — ELVITEG-COBIC-EMTRICIT-TENOFAF 150-150-200-10 MG PO TABS: 1.0000 | ORAL_TABLET | Freq: Every day | ORAL | 0 refills | Status: DC

## 2017-09-30 MED ORDER — ELVITEG-COBIC-EMTRICIT-TENOFAF 150-150-200-10 MG PO TABS: 1.0000 | ORAL_TABLET | Freq: Every day | ORAL | 0 refills | Status: AC

## 2017-09-30 MED ORDER — ELVITEG-COBIC-EMTRICIT-TENOFAF 150-150-200-10 MG PO TABS
1.0000 | ORAL_TABLET | Freq: Every day | ORAL | 0 refills | Status: DC
Start: 2017-09-30 — End: 2017-09-30
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
## 2017-09-30 NOTE — ED Notes (Signed)
[employer] on scene with pt, pt also went to [facility] office as well,

## 2017-10-01 MED FILL — Elvitegrav-Cobic-Emtricitab-Tenofov AF Tab 150-150-200-10 MG: ORAL | Qty: 5 | Status: AC

## 2017-10-01 NOTE — Care Management (Signed)
Received request to followup with patient for aquiring HIV post exposure meds and setting up with followup labs. Called patient - confirmed patient has [employer].  Does not have a PCP.  Informed of offices taking new patients, states her husband is trying to set her up with [facility] (one that is accepting new patients).  Informed patient that they could do followup lab work and meds.  Patient states she understands and is already working on getting an appt.  No further questions, appreciated the call back.    Luz Lex, [place] [address] [phone]

## 2017-10-02 LAB — HEPATITIS C ANTIBODY: HCV Ab: 3.6 s/co ratio — ABNORMAL HIGH (ref 0.0–0.9)

## 2017-10-02 LAB — HEPATITIS B SURFACE ANTIGEN: Hepatitis B Surface Ag: NEGATIVE

## 2017-10-02 LAB — RPR: RPR: NONREACTIVE

## 2018-07-28 IMAGING — CT CT HEAD W/O CM
3 series · 16 of 45 positions shown, 19 images · non-contrast
Comparison: None.

CLINICAL DATA: Left-sided weakness for several days, initial
encounter

EXAM:
CT HEAD WITHOUT CONTRAST
TECHNIQUE: Contiguous axial images were obtained from the base of the skull
through the vertex without intravenous contrast.

[Series 2: head wo · axial · 0.42mm/px · z∈[+307,+422]mm · 10 of 28 slices shown, 13 images]
[im 3/28  brain]
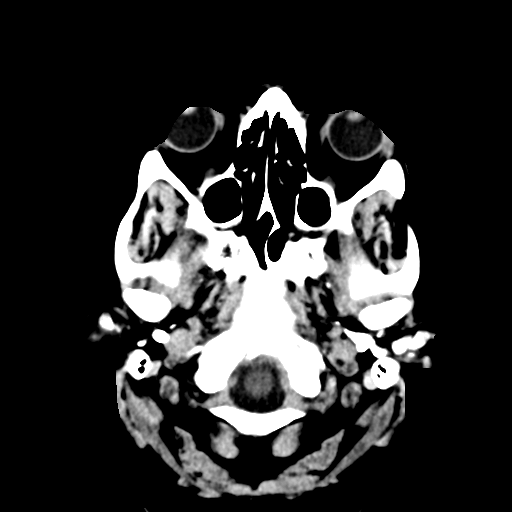
[im 3/28  bone]
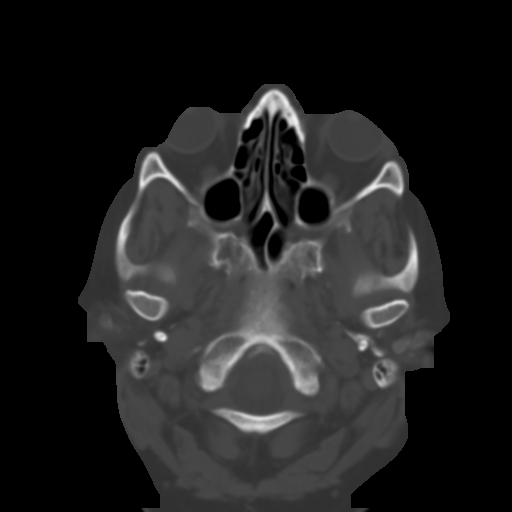
[im 5/28  brain]
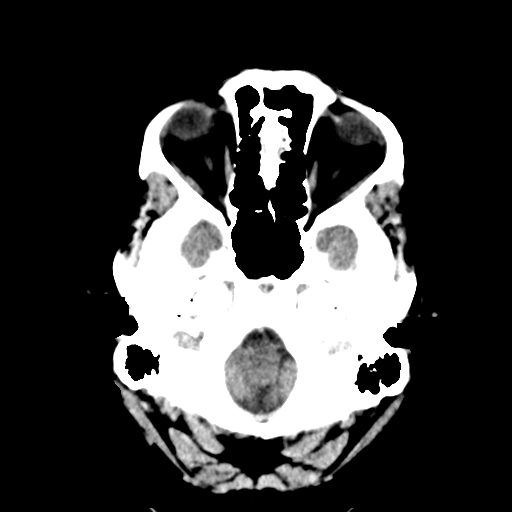
[im 8/28  brain]
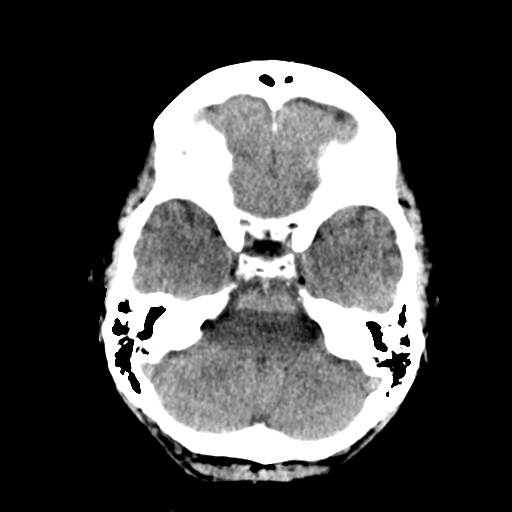
[im 11/28  brain]
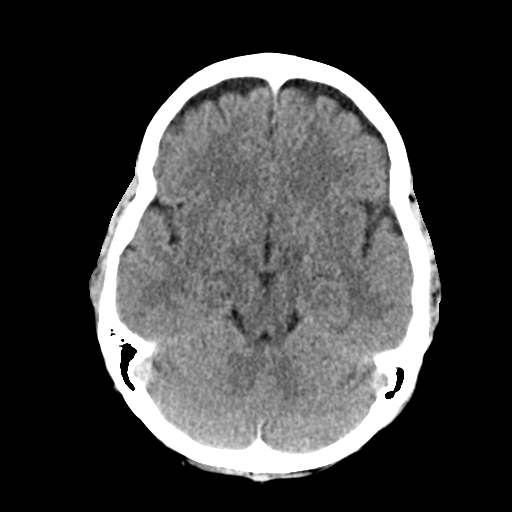
[im 13/28  brain]
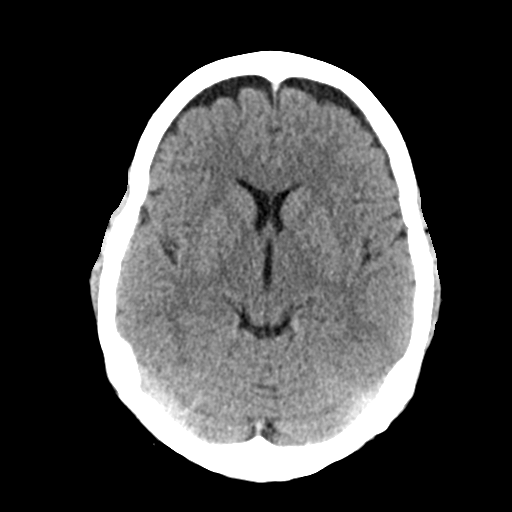
[im 13/28  bone]
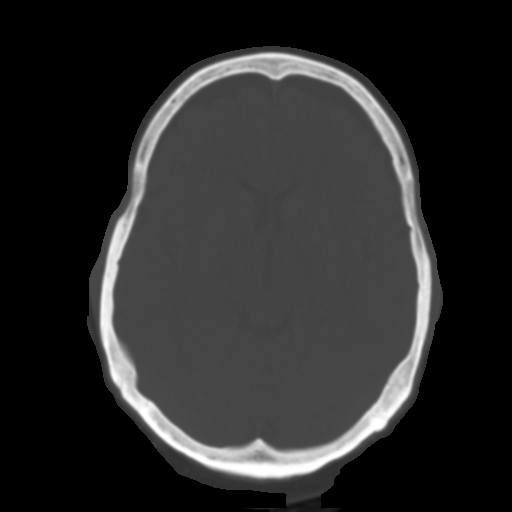
[im 16/28  brain]
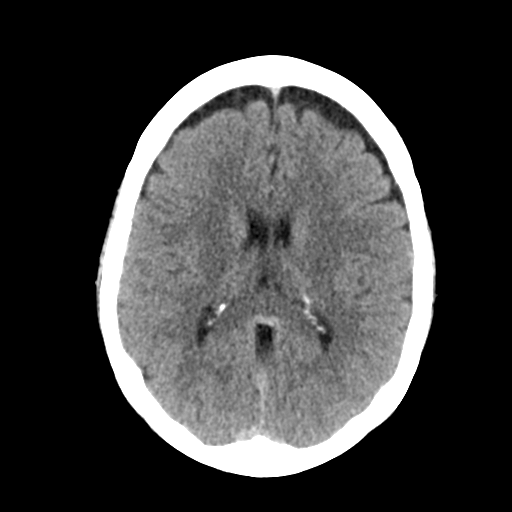
[im 18/28  brain]
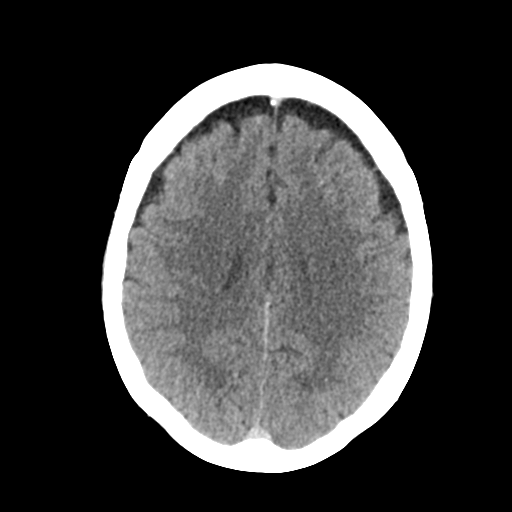
[im 21/28  brain]
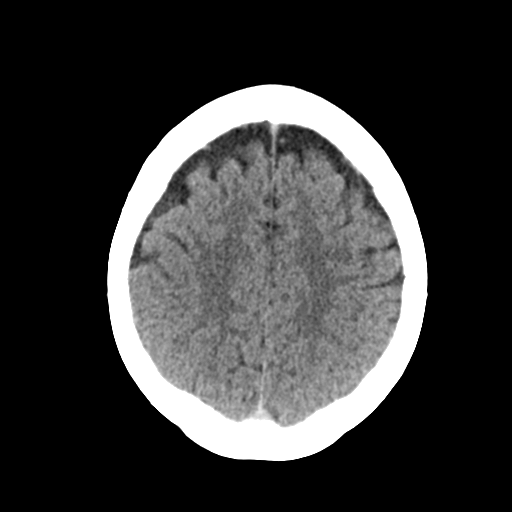
[im 24/28  brain]
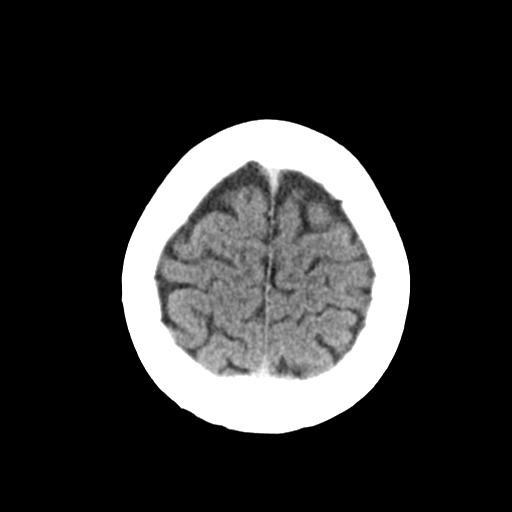
[im 24/28  bone]
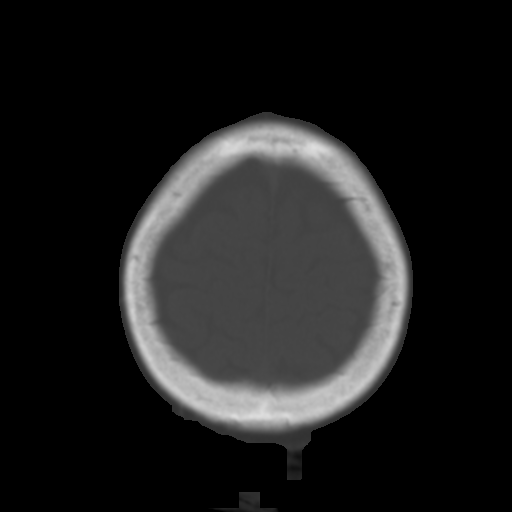
[im 26/28  brain]
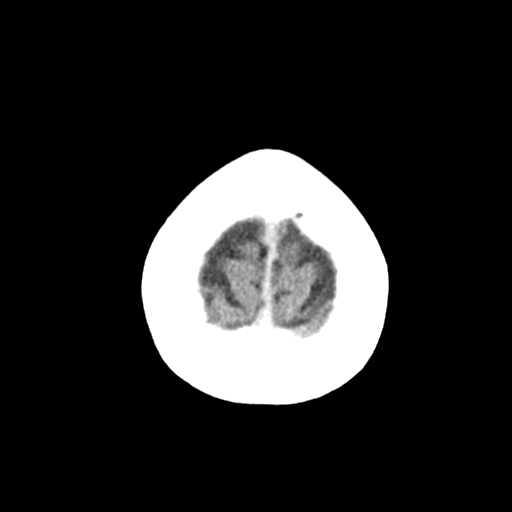

[Series 4: coronal soft tissue · coronal · 0.28mm/px · 3 of 60 slices shown]
[im 20/60  brain]
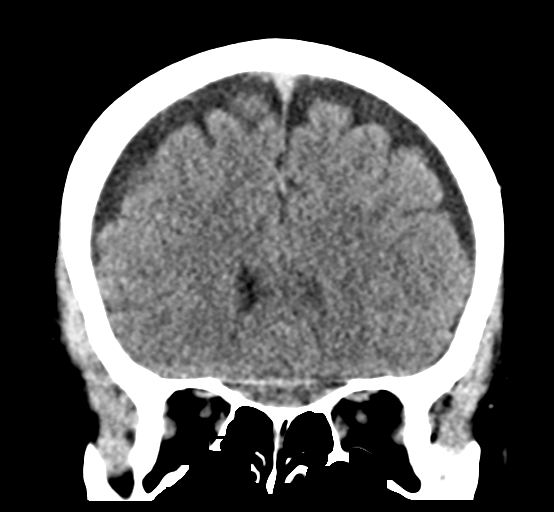
[im 27/60  brain]
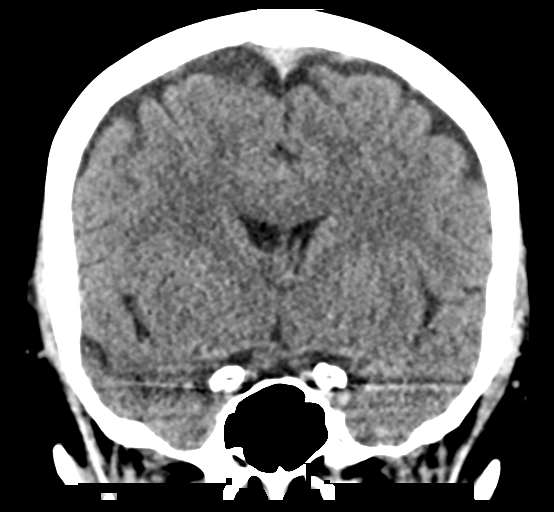
[im 33/60  brain]
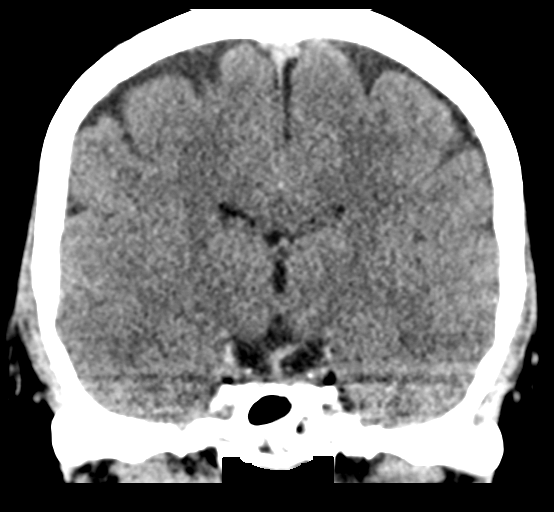

[Series 5: sagittal soft tissue · sagittal · 0.30mm/px · 3 of 51 slices shown]
[im 17/51  brain]
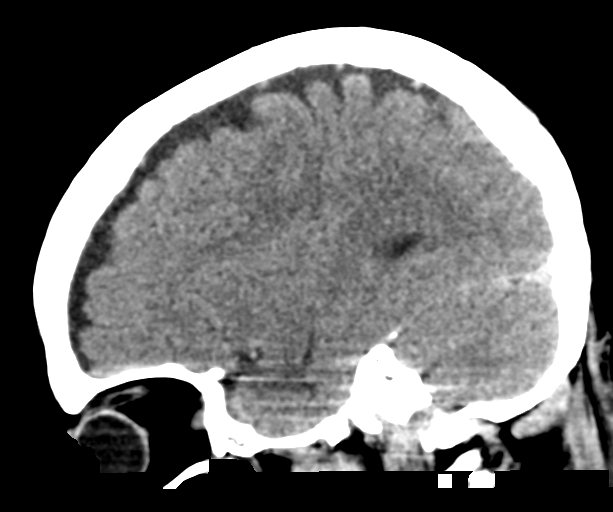
[im 26/51  brain]
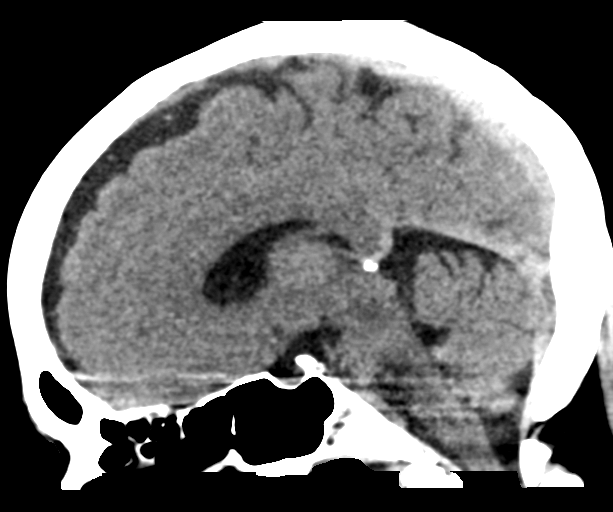
[im 34/51  brain]
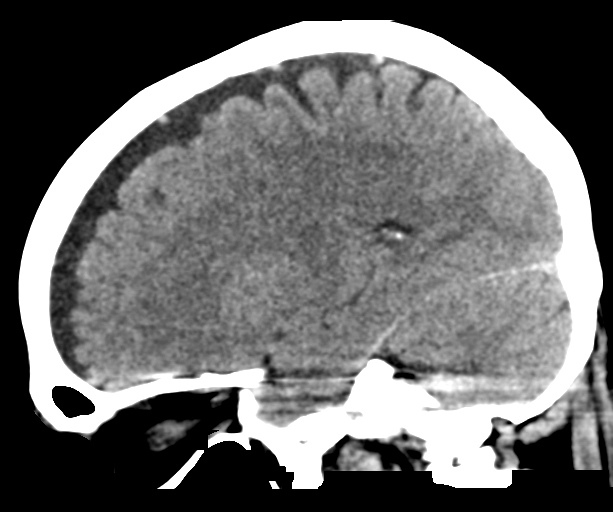

[16 of 45 positions shown; findings below may reference images not displayed]

FINDINGS: Brain: Mild atrophic changes are noted. No findings to suggest acute
hemorrhage, acute infarction or space-occupying mass lesion are
noted.

Vascular: No hyperdense vessel or unexpected calcification.

Skull: Normal. Negative for fracture or focal lesion.

Sinuses/Orbits: No acute finding.

Other: None.
IMPRESSION: Mild atrophic changes are noted.  No acute abnormality is seen.
